# Patient Record
Sex: Male | Born: 2000 | Race: Black or African American | Hispanic: No | Marital: Single | Smoking: Never smoker
Health system: Southern US, Community
[De-identification: ages and names within clinical notes are randomized; demographics above are authoritative.]

## PROBLEM LIST (undated history)

## (undated) ENCOUNTER — Ambulatory Visit (HOSPITAL_COMMUNITY): Admission: EM | Discharge status: Absent without leave | Payer: Medicaid Other

## (undated) ENCOUNTER — Ambulatory Visit (HOSPITAL_COMMUNITY): Admission: EM | Disposition: A | Discharge status: Home / Self Care | Payer: Medicaid Other

## (undated) DIAGNOSIS — R519 Headache, unspecified: Secondary | ICD-10-CM

## (undated) DIAGNOSIS — F812 Mathematics disorder: Secondary | ICD-10-CM

## (undated) DIAGNOSIS — F810 Specific reading disorder: Secondary | ICD-10-CM

## (undated) DIAGNOSIS — F333 Major depressive disorder, recurrent, severe with psychotic symptoms: Secondary | ICD-10-CM

## (undated) DIAGNOSIS — F419 Anxiety disorder, unspecified: Secondary | ICD-10-CM

## (undated) DIAGNOSIS — F79 Unspecified intellectual disabilities: Secondary | ICD-10-CM

## (undated) DIAGNOSIS — F938 Other childhood emotional disorders: Secondary | ICD-10-CM

## (undated) DIAGNOSIS — F909 Attention-deficit hyperactivity disorder, unspecified type: Secondary | ICD-10-CM

## (undated) DIAGNOSIS — J45909 Unspecified asthma, uncomplicated: Secondary | ICD-10-CM

## (undated) DIAGNOSIS — R51 Headache: Secondary | ICD-10-CM

## (undated) DIAGNOSIS — Z8659 Personal history of other mental and behavioral disorders: Secondary | ICD-10-CM

## (undated) HISTORY — PX: TYMPANOSTOMY TUBE PLACEMENT: SHX32

---
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
## 2020-01-28 ENCOUNTER — Ambulatory Visit (HOSPITAL_COMMUNITY): Payer: Self-pay

## 2020-01-29 ENCOUNTER — Ambulatory Visit (HOSPITAL_COMMUNITY)
Admission: EM | Admit: 2020-01-29 | Discharge: 2020-01-29 | Disposition: A | Discharge status: Home / Self Care | Payer: Medicaid Other | Attending: Internal Medicine | Admitting: Internal Medicine

## 2020-01-29 ENCOUNTER — Other Ambulatory Visit: Payer: Self-pay

## 2020-01-29 ENCOUNTER — Encounter (HOSPITAL_COMMUNITY): Payer: Self-pay | Admitting: Emergency Medicine

## 2020-01-29 DIAGNOSIS — R197 Diarrhea, unspecified: Secondary | ICD-10-CM | POA: Insufficient documentation

## 2020-01-29 DIAGNOSIS — Z20822 Contact with and (suspected) exposure to covid-19: Secondary | ICD-10-CM | POA: Insufficient documentation

## 2020-01-29 DIAGNOSIS — Z658 Other specified problems related to psychosocial circumstances: Secondary | ICD-10-CM | POA: Insufficient documentation

## 2020-01-29 DIAGNOSIS — F4325 Adjustment disorder with mixed disturbance of emotions and conduct: Secondary | ICD-10-CM | POA: Insufficient documentation

## 2020-01-29 DIAGNOSIS — R112 Nausea with vomiting, unspecified: Secondary | ICD-10-CM | POA: Diagnosis present

## 2020-01-29 DIAGNOSIS — F79 Unspecified intellectual disabilities: Secondary | ICD-10-CM | POA: Diagnosis not present

## 2020-01-29 DIAGNOSIS — Z791 Long term (current) use of non-steroidal anti-inflammatories (NSAID): Secondary | ICD-10-CM | POA: Diagnosis not present

## 2020-01-29 DIAGNOSIS — Z79899 Other long term (current) drug therapy: Secondary | ICD-10-CM | POA: Diagnosis not present

## 2020-01-29 DIAGNOSIS — Z113 Encounter for screening for infections with a predominantly sexual mode of transmission: Secondary | ICD-10-CM | POA: Insufficient documentation

## 2020-01-29 DIAGNOSIS — J4530 Mild persistent asthma, uncomplicated: Secondary | ICD-10-CM | POA: Diagnosis not present

## 2020-01-29 DIAGNOSIS — Z711 Person with feared health complaint in whom no diagnosis is made: Secondary | ICD-10-CM

## 2020-01-29 DIAGNOSIS — J069 Acute upper respiratory infection, unspecified: Secondary | ICD-10-CM | POA: Diagnosis not present

## 2020-01-29 LAB — CYTOLOGY, (ORAL, ANAL, URETHRAL) ANCILLARY ONLY
Chlamydia: POSITIVE — AB
Comment: NEGATIVE
Comment: NEGATIVE
Comment: NORMAL
Neisseria Gonorrhea: POSITIVE — AB
Trichomonas: NEGATIVE

## 2020-01-29 LAB — CBC WITH DIFFERENTIAL/PLATELET
Abs Immature Granulocytes: 0.01 10*3/uL (ref 0.00–0.07)
Basophils Absolute: 0.1 10*3/uL (ref 0.0–0.1)
Basophils Relative: 2 %
Eosinophils Absolute: 0.1 10*3/uL (ref 0.0–0.5)
Eosinophils Relative: 3 %
HCT: 47 % (ref 39.0–52.0)
Hemoglobin: 15 g/dL (ref 13.0–17.0)
Immature Granulocytes: 0 %
Lymphocytes Relative: 47 %
Lymphs Abs: 1.6 10*3/uL (ref 0.7–4.0)
MCH: 27.7 pg (ref 26.0–34.0)
MCHC: 31.9 g/dL (ref 30.0–36.0)
MCV: 86.7 fL (ref 80.0–100.0)
Monocytes Absolute: 0.4 10*3/uL (ref 0.1–1.0)
Monocytes Relative: 11 %
Neutro Abs: 1.3 10*3/uL — ABNORMAL LOW (ref 1.7–7.7)
Neutrophils Relative %: 37 %
Platelets: 359 10*3/uL (ref 150–400)
RBC: 5.42 MIL/uL (ref 4.22–5.81)
RDW: 15 % (ref 11.5–15.5)
WBC: 3.4 10*3/uL — ABNORMAL LOW (ref 4.0–10.5)
nRBC: 0 % (ref 0.0–0.2)

## 2020-01-29 LAB — BASIC METABOLIC PANEL
Anion gap: 8 (ref 5–15)
BUN: 7 mg/dL (ref 6–20)
CO2: 29 mmol/L (ref 22–32)
Calcium: 9.8 mg/dL (ref 8.9–10.3)
Chloride: 103 mmol/L (ref 98–111)
Creatinine, Ser: 1.08 mg/dL (ref 0.61–1.24)
GFR calc Af Amer: >60 mL/min (ref >60)
GFR calc non Af Amer: >60 mL/min (ref >60)
Glucose, Bld: 93 mg/dL (ref 70–99)
Potassium: 4 mmol/L (ref 3.5–5.1)
Sodium: 140 mmol/L (ref 135–145)

## 2020-01-29 LAB — LIPASE, BLOOD: Lipase: 27 U/L (ref 11–51)

## 2020-01-29 LAB — RPR: RPR Ser Ql: NONREACTIVE

## 2020-01-29 LAB — SARS CORONAVIRUS 2 (TAT 6-24 HRS): SARS Coronavirus 2: NEGATIVE

## 2020-01-29 LAB — HIV ANTIBODY (ROUTINE TESTING W REFLEX): HIV Screen 4th Generation wRfx: NONREACTIVE

## 2020-01-29 MED ORDER — ONDANSETRON HCL 4 MG PO TABS: 4.0000 mg | ORAL_TABLET | Freq: Three times a day (TID) | ORAL | 0 refills | Status: DC | PRN

## 2020-01-29 MED ORDER — ONDANSETRON 4 MG PO TBDP
ORAL_TABLET | ORAL | Status: AC
  Filled 2020-01-29: qty 1

## 2020-01-29 MED ORDER — ONDANSETRON 4 MG PO TBDP
4.0000 mg | ORAL_TABLET | Freq: Once | ORAL | Status: AC
  Administered 2020-01-29: 4 mg via ORAL

## 2020-01-29 MED ORDER — OMEPRAZOLE 20 MG PO CPDR: 20.0000 mg | DELAYED_RELEASE_CAPSULE | Freq: Every day | ORAL | 0 refills | Status: DC

## 2020-01-29 NOTE — Discharge Instructions (Signed)
Zofran every 8 hours as needed for nausea or vomiting.   Please start daily omeprazole.  We will call for any concerning lab findings.  You may monitor your results on your MyChart online as well.   Please use condoms to prevent stds.  If symptoms worsen or do not improve in the next week to return to be seen or to follow up with your PCP.

## 2020-01-29 NOTE — ED Notes (Signed)
Patient able to ambulate independently  

## 2020-01-29 NOTE — ED Provider Notes (Signed)
[facility]    CSN: [id number] Arrival date & time: 01/29/20  7425      History   Chief Complaint Chief Complaint  Patient presents with  . Diarrhea  . Cough    HPI Jorge Day is a 19 y.o. male.   Jorge Day presents with complaints of abdominal pain, vomiting and diarrhea. Symptoms started 1 week ago. Vomited twice yesterday. Prior to that 4-5 times a day. Today has vomited once when he woke today. Black in emesis. Diarrhea, no black or blood to stool. Has had diarrhea approximately 3 times a day. Today had one episode of diarrhea. Chills, no fevers. LUQ abdominal pain. Comes and goes. Hasn't taken any medications for his symptoms. At work had an ill coworker, unknown if they had covid, two weeks ago. Some cough, congestion and sore throat as well. No history of covid-19 and has not received vaccination. Feels weak, no dizziness. Some headache. Eating and drinking triggers emesis. Still urinating well. Has had similar in the past which did resolve, was given medications at the time. No known PUD.   Also requesting std screening. His partner told him she has gonorrhea and chlamydia. He was notified 5 days ago. Maybe some burning with urination. No penile discharge. Doesn't use condoms. 1 partner. Has had stds in the past.    ROS per HPI, negative if not otherwise mentioned.      Past Medical History:  Diagnosis Date  . ADHD (attention deficit hyperactivity disorder)   . Anxiety   . 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 Asthma   . Headache   . History of ADHD 04/16/2015  . Intellectual disability 04/16/2015  . 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 Influenza vaccination declined  04/12/2016  . 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 Intellectual disability 04/16/2015  . Oppositional defiant disorder 04/15/2015  . Family circumstance 12/16/2014  . Psychosocial stressors 12/16/2014  . Mild persistent asthma 10/29/2013  . Seasonal allergies 10/29/2013    Past Surgical History:  Procedure Laterality Date  . CLOSED REDUCTION FINGER WITH PERCUTANEOUS PINNING Right 07/23/2016   Procedure: CLOSED REDUCTION PERCUTANEOUS PINNING OF RIGHT SMALL METACARPAL FRACTURE;  Surgeon: Dairl Ponder, MD;  Location: [facility];  Service: Orthopedics;  Laterality: Right;  . TYMPANOSTOMY TUBE PLACEMENT         Home Medications    Prior to Admission medications   Medication Sig Start Date End Date Taking? Authorizing Provider  albuterol (VENTOLIN HFA) 108 (90 Base) MCG/ACT inhaler Inhale 2 puffs into the lungs every 6 (six) hours as needed for wheezing or shortness of breath.  10/19/15   [provider]  Cetirizine HCl 10 MG CAPS Take 1 capsule (10 mg total) by mouth daily. 02/27/19   Wieters, Hallie C, PA-C  CONCERTA 18 MG CR tablet Take 18 mg by mouth every morning. 12/24/18   [provider]  fluticasone (FLONASE) 50 MCG/ACT nasal spray Place 1 spray into both nostrils daily. Patient taking differently: Place 1 spray into both nostrils daily as needed for allergies.  05/12/19   Avegno, Zachery Dakins, FNP  hydrOXYzine (ATARAX/VISTARIL) 50 MG tablet  Take 1 tablet (50 mg total) by mouth at bedtime. 12/27/18   Leata Mouse, MD  ibuprofen (ADVIL) 800 MG tablet Take 1 tablet (800 mg total) by mouth 3 (three) times daily. 07/18/19   Eustace Moore, MD  OLANZapine (ZYPREXA) 15 MG tablet Take 15 mg by mouth at bedtime. 06/12/19   [provider]  omeprazole (PRILOSEC) 20 MG capsule Take 1  capsule (20 mg total) by mouth daily. 01/29/20   Georgetta Haber, NP  ondansetron (ZOFRAN) 4 MG tablet Take 1 tablet (4 mg total) by mouth every 8 (eight) hours as needed for nausea or vomiting. 01/29/20   Georgetta Haber, NP  traZODone (DESYREL) 50 MG tablet Take 50 mg by mouth at bedtime as needed for sleep. 05/14/19   [provider]  dicyclomine (BENTYL) 20 MG tablet Take 1 tablet (20 mg total) by mouth 4 (four) times daily -  before meals and at bedtime. 02/27/19 07/18/19  Wieters, Junius Creamer, PA-C    Family History Family History  Problem Relation Age of Onset  . Depression Mother   . Hypertension Mother   . Asthma Father   . Alcohol abuse Father   . Mental illness Brother   . Mental illness Maternal Aunt   . Cancer Maternal Grandmother   . Brain cancer Maternal Grandmother     Social History Social History   Tobacco Use  . Smoking status: Passive Smoke Exposure - Never Smoker  . Smokeless tobacco: Never Used  Substance Use Topics  . Alcohol use: No  . Drug use: No     Allergies   Patient has no known allergies.   Review of Systems Review of Systems   Physical Exam Triage Vital Signs ED Triage Vitals  Enc Vitals Group     BP 01/29/20 0825 126/73     Pulse Rate 01/29/20 0825 (!) 57     Resp 01/29/20 0825 18     Temp 01/29/20 0825 98.5 F (36.9 C)     Temp src --      SpO2 01/29/20 0825 100 %     Weight --      Height --      Head Circumference --      Peak Flow --      Pain Score 01/29/20 0826 4     Pain Loc --      Pain Edu? --      Excl. in GC? --    No data found.  Updated Vital Signs BP 126/73 (BP Location: Left Arm)   Pulse (!) 57   Temp 98.5 F (36.9 C)   Resp 18   SpO2 100%   Visual Acuity Right Eye Distance:   Left Eye Distance:   Bilateral Distance:    Right Eye Near:   Left Eye Near:    Bilateral Near:     Physical Exam Constitutional:      Appearance: He is well-developed.  Cardiovascular:     Rate and Rhythm:  Normal rate.  Pulmonary:     Effort: Pulmonary effort is normal.  Abdominal:     Tenderness: There is abdominal tenderness in the epigastric area.  Skin:    General: Skin is warm and dry.  Neurological:     Mental Status: He is alert and oriented to person, place, and time.      UC Treatments / Results  Labs (all labs ordered are listed, but only abnormal results are displayed) Labs Reviewed  SARS CORONAVIRUS 2 (  TAT 6-24 HRS)  CBC WITH DIFFERENTIAL/PLATELET  BASIC METABOLIC PANEL  HIV ANTIBODY (ROUTINE TESTING W REFLEX)  RPR  LIPASE, BLOOD  CYTOLOGY, (ORAL, ANAL, URETHRAL) ANCILLARY ONLY    EKG   Radiology No results found.  Procedures Procedures (including critical care time)  Medications Ordered in UC Medications  ondansetron (ZOFRAN-ODT) disintegrating tablet 4 mg (4 mg Oral Given 01/29/20 0858)    Initial Impression / Assessment and Plan / UC Course  I have reviewed the triage vital signs and the nursing notes.  Pertinent labs & imaging results that were available during my care of the patient were reviewed by me and considered in my medical decision making (see chart for details).     zofran and omeprazole provided for epigastric pain. Basic labs and lipase pending. covid testing collected and pending as well. Std screening collected, opted to wait on treatment due to current N/V/D symptoms. Return precautions provided. Patient verbalized understanding and agreeable to plan.   Final Clinical Impressions(s) / UC Diagnoses   Final diagnoses:  Nausea vomiting and diarrhea  Upper respiratory tract infection, unspecified type  Concern about STD in male without diagnosis  Screen for STD (sexually transmitted disease)     Discharge Instructions     Zofran every 8 hours as needed for nausea or vomiting.   Please start daily omeprazole.  We will call for any concerning lab findings.  You may monitor your results on your MyChart online as well.   Please use  condoms to prevent stds.  If symptoms worsen or do not improve in the next week to return to be seen or to follow up with your PCP.      ED Prescriptions    Medication Sig Dispense Auth. Provider   ondansetron (ZOFRAN) 4 MG tablet Take 1 tablet (4 mg total) by mouth every 8 (eight) hours as needed for nausea or vomiting. 10 tablet Linus Mako B, NP   omeprazole (PRILOSEC) 20 MG capsule Take 1 capsule (20 mg total) by mouth daily. 30 capsule Georgetta Haber, NP     PDMP not reviewed this encounter.   Georgetta Haber, NP 01/29/20 [id number]

## 2020-01-29 NOTE — ED Triage Notes (Signed)
Pt presents to [facility] for assessment of diarrhea, emesis, cough x 3 days.  Patient states 2 episodes of emesis in the past 24 hours.  Pt c/o epigastric discomfort.

## 2020-02-03 ENCOUNTER — Telehealth (HOSPITAL_COMMUNITY): Payer: Self-pay | Admitting: Emergency Medicine

## 2020-02-03 MED ORDER — AZITHROMYCIN 250 MG PO TABS: 1000.0000 mg | ORAL_TABLET | Freq: Once | ORAL | 0 refills | Status: AC

## 2020-02-03 NOTE — Progress Notes (Signed)
Attempted to reach patient x 1, unable to LVM  Positive for Gonorrhea and Chlamydia.  Will need treatment with 500mg  IM Rocephin and 1G Azithromycin.  Azithromycin sent to pharmacy on file.  Patient will need to return for Rocephin.

## 2020-02-05 ENCOUNTER — Other Ambulatory Visit: Payer: Self-pay

## 2020-02-05 ENCOUNTER — Telehealth (HOSPITAL_COMMUNITY): Payer: Self-pay | Admitting: Emergency Medicine

## 2020-02-05 ENCOUNTER — Ambulatory Visit (HOSPITAL_COMMUNITY)
Admission: EM | Admit: 2020-02-05 | Discharge: 2020-02-05 | Disposition: A | Discharge status: Home / Self Care | Payer: Medicaid Other | Attending: Internal Medicine | Admitting: Internal Medicine

## 2020-02-05 DIAGNOSIS — A549 Gonococcal infection, unspecified: Secondary | ICD-10-CM | POA: Diagnosis not present

## 2020-02-05 DIAGNOSIS — A545 Gonococcal pharyngitis: Secondary | ICD-10-CM | POA: Diagnosis not present

## 2020-02-05 MED ORDER — CEFTRIAXONE SODIUM 500 MG IJ SOLR
500.0000 mg | Freq: Once | INTRAMUSCULAR | Status: AC
  Administered 2020-02-05: 500 mg via INTRAMUSCULAR

## 2020-02-05 MED ORDER — LIDOCAINE HCL (PF) 1 % IJ SOLN
INTRAMUSCULAR | Status: AC
  Filled 2020-02-05: qty 2

## 2020-02-05 MED ORDER — AZITHROMYCIN 250 MG PO TABS: 1000.0000 mg | ORAL_TABLET | Freq: Once | ORAL | 0 refills | Status: AC

## 2020-02-05 MED ORDER — CEFTRIAXONE SODIUM 500 MG IJ SOLR
INTRAMUSCULAR | Status: AC
  Filled 2020-02-05: qty 500

## 2020-02-05 NOTE — ED Triage Notes (Signed)
Patient presents for treatment for Gonorrhea

## 2020-02-07 ENCOUNTER — Emergency Department (HOSPITAL_COMMUNITY)
Admission: EM | Admit: 2020-02-07 | Discharge: 2020-02-07 | Disposition: A | Discharge status: Home / Self Care | Payer: Medicaid Other | Attending: Emergency Medicine | Admitting: Emergency Medicine

## 2020-02-07 ENCOUNTER — Encounter (HOSPITAL_COMMUNITY): Payer: Self-pay | Admitting: *Deleted

## 2020-02-07 DIAGNOSIS — J4530 Mild persistent asthma, uncomplicated: Secondary | ICD-10-CM | POA: Insufficient documentation

## 2020-02-07 DIAGNOSIS — R109 Unspecified abdominal pain: Secondary | ICD-10-CM | POA: Diagnosis present

## 2020-02-07 DIAGNOSIS — K529 Noninfective gastroenteritis and colitis, unspecified: Secondary | ICD-10-CM

## 2020-02-07 DIAGNOSIS — Z7722 Contact with and (suspected) exposure to environmental tobacco smoke (acute) (chronic): Secondary | ICD-10-CM | POA: Diagnosis not present

## 2020-02-07 DIAGNOSIS — Z79899 Other long term (current) drug therapy: Secondary | ICD-10-CM | POA: Diagnosis not present

## 2020-02-07 DIAGNOSIS — F902 Attention-deficit hyperactivity disorder, combined type: Secondary | ICD-10-CM | POA: Diagnosis not present

## 2020-02-07 LAB — LIPASE, BLOOD: Lipase: 24 U/L (ref 11–51)

## 2020-02-07 LAB — URINALYSIS, ROUTINE W REFLEX MICROSCOPIC
Bilirubin Urine: NEGATIVE
Glucose, UA: NEGATIVE mg/dL
Hgb urine dipstick: NEGATIVE
Ketones, ur: NEGATIVE mg/dL
Nitrite: NEGATIVE
Protein, ur: 30 mg/dL — AB
Specific Gravity, Urine: 1.025 (ref 1.005–1.030)
pH: 5 (ref 5.0–8.0)

## 2020-02-07 LAB — CBC
HCT: 44.8 % (ref 39.0–52.0)
Hemoglobin: 14.4 g/dL (ref 13.0–17.0)
MCH: 27.8 pg (ref 26.0–34.0)
MCHC: 32.1 g/dL (ref 30.0–36.0)
MCV: 86.5 fL (ref 80.0–100.0)
Platelets: 313 10*3/uL (ref 150–400)
RBC: 5.18 MIL/uL (ref 4.22–5.81)
RDW: 14.5 % (ref 11.5–15.5)
WBC: 5.5 10*3/uL (ref 4.0–10.5)
nRBC: 0 % (ref 0.0–0.2)

## 2020-02-07 LAB — COMPREHENSIVE METABOLIC PANEL
ALT: 13 U/L (ref 0–44)
AST: 23 U/L (ref 15–41)
Albumin: 4.4 g/dL (ref 3.5–5.0)
Alkaline Phosphatase: 47 U/L (ref 38–126)
Anion gap: 12 (ref 5–15)
BUN: 6 mg/dL (ref 6–20)
CO2: 24 mmol/L (ref 22–32)
Calcium: 9.8 mg/dL (ref 8.9–10.3)
Chloride: 104 mmol/L (ref 98–111)
Creatinine, Ser: 1.24 mg/dL (ref 0.61–1.24)
GFR calc Af Amer: >60 mL/min (ref >60)
GFR calc non Af Amer: >60 mL/min (ref >60)
Glucose, Bld: 88 mg/dL (ref 70–99)
Potassium: 3.5 mmol/L (ref 3.5–5.1)
Sodium: 140 mmol/L (ref 135–145)
Total Bilirubin: 1.2 mg/dL (ref 0.3–1.2)
Total Protein: 7.2 g/dL (ref 6.5–8.1)

## 2020-02-07 MED ORDER — ONDANSETRON 4 MG PO TBDP: 4.0000 mg | ORAL_TABLET | Freq: Three times a day (TID) | ORAL | 0 refills | Status: DC | PRN

## 2020-02-07 MED ORDER — SODIUM CHLORIDE 0.9 % IV BOLUS
1000.0000 mL | Freq: Once | INTRAVENOUS | Status: AC
  Administered 2020-02-07: 1000 mL via INTRAVENOUS

## 2020-02-07 MED ORDER — ONDANSETRON 4 MG PO TBDP
4.0000 mg | ORAL_TABLET | Freq: Once | ORAL | Status: AC
  Administered 2020-02-07: 4 mg via ORAL
  Filled 2020-02-07: qty 1

## 2020-02-07 NOTE — ED Triage Notes (Signed)
To ED for eval of abd pain starting 3-4 days ago. Become worse. No pain with urination. Diarrhea and vomiting. zofran given by ems. Seen for same at [facility] in august.

## 2020-02-07 NOTE — Discharge Instructions (Signed)
At this time there does not appear to be the presence of an emergent medical condition, however there is always the potential for conditions to change. Please read and follow the below instructions.  Please return to the Emergency Department immediately for any new or worsening symptoms or if your symptoms do not improve within 3 days. Please be sure to follow up with your Primary Care Provider within one week regarding your visit today; please call their office to schedule an appointment even if you are feeling better for a follow-up visit. You may take the medication Zofran as prescribed to help with nausea/vomiting.  This medication will dissolve under your tongue.  You may also continue taking the medication omeprazole which was prescribed to you by the urgent care. Please drink plenty of water and get plenty of rest.  You may call the specialists at [facility] gastroenterology to schedule a follow-up appointment regarding your chronic abdominal symptoms.  Get help right away if: Your pain gets worse or moves to the right side of your belly, especially the right lower side. You cannot stop vomiting. Your pain is only in areas of your belly, such as the right side or the left lower part of the belly. You have fever or chills You have bloody or black poop, or poop that looks like tar. You have very bad pain, cramping, or bloating in your belly. You have signs of not having enough fluid or water in your body (dehydration), such as: Dark pee, very little pee, or no pee. Cracked lips. Dry mouth. Sunken eyes. Sleepiness. Weakness. You have trouble breathing or chest pain. You have testicle pain or swelling You have burning when you pee or discharge from your penis. You have any new/concerning or worsening of symptoms  Please read the additional information packets attached to your discharge summary.  Do not take your medicine if  develop an itchy rash, swelling in your mouth or lips, or  difficulty breathing; call 911 and seek immediate emergency medical attention if this occurs.  You may review your lab tests and imaging results in their entirety on your MyChart account.  Please discuss all results of fully with your primary care provider and other specialist at your follow-up visit.  Note: Portions of this text may have been transcribed using voice recognition software. Every effort was made to ensure accuracy; however, inadvertent computerized transcription errors may still be present.

## 2020-02-07 NOTE — ED Provider Notes (Addendum)
[facility] EMERGENCY DEPARTMENT Provider Note   CSN: [id number] Arrival date & time: 02/07/20  3295     History Chief Complaint  Patient presents with  . Abdominal Pain    Jorge Day is a 19 y.o. male history ADHD, MDD, anxiety, asthma.  Patient presents today for left-sided abdominal pain onset 3-4 days ago pain is intermittent pulling/cramping sensation nonradiating no clear aggravating or alleviating factors.  Patient reports small episode of nonbloody/nonbilious emesis this morning and few episodes of nonbloody diarrhea.  He reports this is a frequent problem for him in the past.  He reports he was prescribed Zofran by urgent care late last month with improvement of his symptoms.  He was also seen for the same back in April and symptoms resolved then as well.  Additionally patient reports he was concern for STI due to known positive exposure in August as well, he tested positive for gonorrhea/chlamydia.  Patient reports he received Rocephin and azithromycin, he completed that course and has had no further STI related concerns.  Denies fever/chills, chest pain/shortness of breath, dysuria/hematuria, penile discharge, testicular pain/swelling, melena, hematochezia, hematemesis, right-sided abdominal pain or any additional concerns.   HPI     Past Medical History:  Diagnosis Date  . ADHD (attention deficit hyperactivity disorder)   . Anxiety   . 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 Asthma   . Headache   . History of ADHD 04/16/2015  . Intellectual disability 04/16/2015  . 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 Influenza vaccination declined 04/12/2016  . ADHD (attention  deficit hyperactivity disorder), 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  . 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 Intellectual disability 04/16/2015  . Oppositional defiant disorder 04/15/2015  . Family circumstance 12/16/2014  . Psychosocial stressors 12/16/2014  . Mild persistent asthma 10/29/2013  . Seasonal allergies 10/29/2013    Past Surgical History:  Procedure Laterality Date  . CLOSED REDUCTION FINGER WITH PERCUTANEOUS PINNING Right 07/23/2016   Procedure: CLOSED REDUCTION PERCUTANEOUS PINNING OF RIGHT SMALL METACARPAL FRACTURE;  Surgeon: Dairl Ponder, MD;  Location: [facility] OR;  Service: Orthopedics;  Laterality: Right;  . TYMPANOSTOMY TUBE PLACEMENT         Family History  Problem Relation Age of Onset  . Depression Mother   . Hypertension Mother   . Asthma Father   . Alcohol abuse Father   . Mental illness Brother   . Mental illness Maternal Aunt   . Cancer Maternal Grandmother   . Brain cancer Maternal Grandmother     Social History   Tobacco Use  . Smoking status: Passive Smoke Exposure - Never Smoker  . Smokeless tobacco: Never Used  Substance Use Topics  . Alcohol use: No  . Drug use: No    Home Medications Prior to Admission medications   Medication Sig Start Date End Date Taking? Authorizing Provider  albuterol (VENTOLIN HFA) 108 (90 Base) MCG/ACT inhaler Inhale 2 puffs into the lungs every 6 (six) hours as needed for wheezing or shortness of breath.  10/19/15  Yes [provider]  Cetirizine HCl 10 MG CAPS Take 1 capsule (10 mg total) by mouth daily. 02/27/19  Yes Wieters, Maud C, PA-C  CONCERTA 18 MG CR tablet Take 18 mg by mouth every morning. 12/24/18  Yes [provider]  fluticasone (FLONASE) 50 MCG/ACT nasal spray Place 1 spray into both nostrils daily. Patient taking differently: Place 1 spray into both nostrils daily as needed for allergies.   05/12/19  Yes Avegno, Zachery Dakins, FNP  hydrOXYzine (ATARAX/VISTARIL) 50 MG tablet Take 1 tablet (50 mg total) by mouth at bedtime. 12/27/18  Yes Leata Mouse, MD  ibuprofen (ADVIL) 800 MG tablet Take 1 tablet (800 mg total) by mouth 3 (three) times daily. 07/18/19  Yes Eustace Moore, MD  omeprazole (PRILOSEC) 20 MG capsule Take 1 capsule (20 mg total) by mouth daily. 01/29/20  Yes Burky, Dorene Grebe B, NP  paliperidone (INVEGA) 6 MG 24 hr tablet Take 6 mg by mouth daily. 12/18/19  Yes [provider]  traZODone (DESYREL) 50 MG tablet Take 50 mg by mouth at bedtime as needed for sleep. 05/14/19  Yes [provider]  OLANZapine (ZYPREXA) 15 MG tablet Take 15 mg by mouth at bedtime. Patient not taking: Reported on 02/07/2020 06/12/19   [provider]  ondansetron (ZOFRAN ODT) 4 MG disintegrating tablet Take 1 tablet (4 mg total) by mouth every 8 (eight) hours as needed for nausea or vomiting. 02/07/20   Harlene Salts A, PA-C  dicyclomine (BENTYL) 20 MG tablet Take 1 tablet (20 mg total) by mouth 4 (four) times daily -  before meals and at bedtime. 02/27/19 07/18/19  Wieters, Hallie C, PA-C    Allergies    Patient has no known allergies.  Review of Systems   Review of Systems Ten systems are reviewed and are negative for acute change except as noted in the HPI  Physical Exam Updated Vital Signs BP 127/71 (BP Location: Right Arm)   Pulse 60   Temp 97.6 F (36.4 C) (Oral)   Resp 16   SpO2 99%   Physical Exam Constitutional:      General: He is not in acute distress.    Appearance: Normal appearance. He is well-developed. He is not ill-appearing or diaphoretic.  HENT:     Head: Normocephalic and atraumatic.  Eyes:     General: Vision grossly intact. Gaze aligned appropriately.     Pupils: Pupils are equal, round, and reactive to light.  Neck:     Trachea: Trachea and phonation normal.  Pulmonary:     Effort: Pulmonary effort is normal. No respiratory  distress.  Abdominal:     General: There is no distension.     Palpations: Abdomen is soft.     Tenderness: There is abdominal tenderness in the left upper quadrant. There is no guarding or rebound. Negative signs include Murphy's sign and McBurney's sign.  Genitourinary:    Comments: Deferred by patient Musculoskeletal:        General: Normal range of motion.     Cervical back: Normal range of motion.  Skin:    General: Skin is warm and dry.  Neurological:     Mental Status: He is alert.     GCS: GCS eye subscore is 4. GCS verbal subscore is 5. GCS motor subscore is 6.     Comments: Speech is clear and goal oriented, follows commands Major Cranial nerves without deficit, no facial droop Moves extremities without ataxia, coordination intact  Psychiatric:        Behavior: Behavior normal.    ED Results / Procedures / Treatments   Labs (all labs ordered are listed, but only abnormal  results are displayed) Labs Reviewed  URINALYSIS, ROUTINE W REFLEX MICROSCOPIC - Abnormal; Notable for the following components:      Result Value   APPearance HAZY (*)    Protein, ur 30 (*)    Leukocytes,Ua MODERATE (*)    Bacteria, UA RARE (*)    Non Squamous Epithelial 0-5 (*)    All other components within normal limits  URINE CULTURE  LIPASE, BLOOD  COMPREHENSIVE METABOLIC PANEL  CBC    EKG None  Radiology No results found.  Procedures Procedures (including critical care time)  Medications Ordered in ED Medications  sodium chloride 0.9 % bolus 1,000 mL (1,000 mLs Intravenous New Bag/Given 02/07/20 1023)  ondansetron (ZOFRAN-ODT) disintegrating tablet 4 mg (4 mg Oral Given 02/07/20 1025)    ED Course  I have reviewed the triage vital signs and the nursing notes.  Pertinent labs & imaging results that were available during my care of the patient were reviewed by me and considered in my medical decision making (see chart for details).    MDM Rules/Calculators/A&P                           Additional history obtained from: 1. Nursing notes from this visit. 2. Electronic medical record.  Patient received treatment for gonorrhea/chlamydia through urgent care.  He presented for similar symptoms of left-sided abdominal pain in April 2021, had negative CT abdomen pelvis at that time. ------------------------------ I ordered, reviewed and interpreted labs which include: CBC within normal limits, no anemia or leukocytosis. CMP within normal limits, no emergent electrolyte derangement, AKI, LFT elevations or gap. Lipase within normal limits. Urinalysis shows WBCs, bacteria, leukocytes, nitrite negative. Urine culture in process.  19 year old male presented for left-sided abdominal pain for 3-4 days similar to pain that occurred in August and in April.  In April he underwent CT scan which was negative in the emergency department.  Pain is waxing and waning.  On evaluation he is well-appearing no acute distress minimal left-sided abdominal pain without rebound guarding or tenderness.  No right upper quadrant or right lower quadrant tenderness and no central abdominal pain.  He is afebrile with normal vital signs.  Blood work today is reassuring, no leukocytosis.  He does have some bacteria in his urine but was just treated 3 days ago for gonorrhea/chlamydia through the urgent care.  He has no testicular pain swelling or urinary symptoms to suggest epididymitis or other ascending infections, he did defer GU examination which I feel is reasonable based on patient's symptoms today.  Patient stated understanding of limitations of examination without GU exam.  Suspect abnormalities in UA secondary to recently treated GC chlamydia.  Culture sent, patient encouraged to follow-up with PCP.  Based on symptoms and presentation and work-up today doubt cholecystitis, pancreatitis, appendicitis, SBO, diverticulitis, epididymitis or other emergent intra-abdominal pelvic etiologies.  I discussed imaging/CT  scan for evaluation of patient's abdominal pain today, shared decision making made including risks versus benefits.  Patient elects to forego CT scan today which I feel is a reasonable choice given his age and wanting to avoid excess radiation.  Suspect patient experiencing gastroenteritis, will refer to gastroenterology given recurrence of symptoms over this year and encourage PCP follow-up.  At this time there does not appear to be any evidence of an acute emergency medical condition and the patient appears stable for discharge with appropriate outpatient follow up. Diagnosis was discussed with patient who verbalizes understanding of care plan  and is agreeable to discharge. I have discussed return precautions with patient who verbalizes understanding. Patient encouraged to follow-up with their PCP and GI. All questions answered.  Patient's case discussed with Dr. Criss Alvine who agrees with plan to discharge with outpatient follow-up.   Note: Portions of this report may have been transcribed using voice recognition software. Every effort was made to ensure accuracy; however, inadvertent computerized transcription errors may still be present. Final Clinical Impression(s) / ED Diagnoses Final diagnoses:  Abdominal pain, unspecified abdominal location  Gastroenteritis    Rx / DC Orders ED Discharge Orders         Ordered    ondansetron (ZOFRAN ODT) 4 MG disintegrating tablet  Every 8 hours PRN        02/07/20 1125           Elizabeth Palau 02/07/20 1133    Pricilla Loveless, MD 02/07/20 1301

## 2020-02-08 LAB — URINE CULTURE: Culture: NO GROWTH

## 2020-04-13 ENCOUNTER — Ambulatory Visit (HOSPITAL_COMMUNITY)
Admission: EM | Admit: 2020-04-13 | Discharge: 2020-04-13 | Disposition: A | Discharge status: Home / Self Care | Payer: Medicaid Other | Attending: Family Medicine | Admitting: Family Medicine

## 2020-04-13 ENCOUNTER — Other Ambulatory Visit: Payer: Self-pay

## 2020-04-13 ENCOUNTER — Encounter (HOSPITAL_COMMUNITY): Payer: Self-pay

## 2020-04-13 DIAGNOSIS — Z113 Encounter for screening for infections with a predominantly sexual mode of transmission: Secondary | ICD-10-CM | POA: Insufficient documentation

## 2020-04-13 DIAGNOSIS — H1031 Unspecified acute conjunctivitis, right eye: Secondary | ICD-10-CM | POA: Insufficient documentation

## 2020-04-13 LAB — HIV ANTIBODY (ROUTINE TESTING W REFLEX): HIV Screen 4th Generation wRfx: NONREACTIVE

## 2020-04-13 MED ORDER — TOBRAMYCIN 0.3 % OP SOLN: 1.0000 [drp] | Freq: Four times a day (QID) | OPHTHALMIC | 0 refills | Status: DC

## 2020-04-13 MED ORDER — OLOPATADINE HCL 0.2 % OP SOLN: 1.0000 [drp] | Freq: Every day | OPHTHALMIC | 0 refills | Status: DC

## 2020-04-13 NOTE — ED Provider Notes (Signed)
[facility] CARE [facility]ENTER   [id number] 04/13/20 Arrival Time: 0906  ASSESSMENT & PLAN:  1. Acute conjunctivitis of right eye, unspecified acute conjunctivitis type   2. Screening for STDs (sexually transmitted diseases)     Begin: Meds ordered this encounter  Medications  . Olopatadine HCl 0.2 % SOLN    Sig: Apply 1 drop to eye daily.    Dispense:  2.5 mL    Refill:  0  . tobramycin (TOBREX) 0.3 % ophthalmic solution    Sig: Place 1 drop into the right eye every 6 (six) hours.    Dispense:  5 mL    Refill:  0    Discussed the diagnosis and proper care of conjunctivitis.  Stressed household Presenter, broadcasting. Ophthalmic drops per orders. Warm compress to eye(s). Local eye care discussed. School note provided.    Discharge Instructions     We have sent testing for sexually transmitted infections. We will notify you of any positive results once they are received. If required, we will prescribe any medications you might need.  Please refrain from all sexual activity for at least the next seven days.      Reviewed expectations re: course of current medical issues. Questions answered. Outlined signs and symptoms indicating need for more acute intervention. Patient verbalized understanding. After Visit Summary given.   SUBJECTIVE:  Jorge Day is a 19 y.o. male who presents with complaint of persistent L eye irritation.  Onset abrupt, approximately 2-3 days ago. Itchy/gritty feeling. Injury: no. Visual changes: no. Contact lens use: no. Recent illness: no. Self treatment: none.  Also requets STD testing. No symptoms. Is sexually active. H/O treated chlamydia.  OBJECTIVE:  Vitals:   04/13/20 0937  BP: (!) 122/58  Pulse: (!) 59  Resp: 17  Temp: 98.5 F (36.9 C)  TempSrc: Oral  SpO2: 100%    General appearance: alert; no distress HEENT: [place]; AT; PERRLA; no restriction of extraocular movements OS: without reported pain; with conjunctival injection; with watery  drainage; without corneal opacities; without limbal flush; without periorbital swelling or erythema OD: normal Neck: supple without LAD Lungs: unlabored respirations Skin: warm and dry Psychological: alert and cooperative; normal mood and affect     No Known Allergies  Past Medical History:  Diagnosis Date  . ADHD (attention deficit hyperactivity disorder)   . Anxiety   . 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 Asthma   . Headache   . History of ADHD 04/16/2015  . Intellectual disability 04/16/2015  . 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 Marital status: Single    Spouse name: Not on file  . Number of children: Not on file  . Years of education: Not on file  . Highest education level: Not on file  Occupational History  . Not on file  Tobacco Use  . Smoking status: Passive Smoke Exposure - Never Smoker  . Smokeless tobacco: Never Used  Substance and Sexual Activity  . Alcohol use: No  . Drug use: No  . Sexual activity: Yes    Birth control/protection: None  Other Topics Concern  . Not on file  Social History Narrative  . Not on file   Social Determinants of Health   Financial Resource Strain:   . Difficulty of Paying Living Expenses: Not on file  Food Insecurity:   . Worried About Programme researcher, broadcasting/film/video  in the Last Year: Not on file  . Ran Out of Food in the Last Year: Not on file  Transportation Needs:   . Lack of Transportation (Medical): Not on file  . Lack of Transportation (Non-Medical): Not on file  Physical Activity:   . Days of Exercise per Week: Not on file  . Minutes of Exercise per Session: Not on file  Stress:   . Feeling of Stress : Not on file  Social Connections:   . Frequency of Communication with Friends and Family: Not on file  . Frequency of Social Gatherings  with Friends and Family: Not on file  . Attends Religious Services: Not on file  . Active Member of Clubs or Organizations: Not on file  . Attends Banker Meetings: Not on file  . Marital Status: Not on file  Intimate Partner Violence:   . Fear of Current or Ex-Partner: Not on file  . Emotionally Abused: Not on file  . Physically Abused: Not on file  . Sexually Abused: Not on file   Family History  Problem Relation Age of Onset  . Depression Mother   . Hypertension Mother   . Asthma Father   . Alcohol abuse Father   . Mental illness Brother   . Mental illness Maternal Aunt   . Cancer Maternal Grandmother   . Brain cancer Maternal Grandmother    Past Surgical History:  Procedure Laterality Date  . CLOSED REDUCTION FINGER WITH PERCUTANEOUS PINNING Right 07/23/2016   Procedure: CLOSED REDUCTION PERCUTANEOUS PINNING OF RIGHT SMALL METACARPAL FRACTURE;  Surgeon: Dairl Ponder, MD;  Location: [facility] OR;  Service: Orthopedics;  Laterality: Right;  . TYMPANOSTOMY TUBE PLACEMENT       Mardella Layman, MD 04/13/20 1015

## 2020-04-13 NOTE — Discharge Instructions (Addendum)
We have sent testing for sexually transmitted infections. We will notify you of any positive results once they are received. If required, we will prescribe any medications you might need.  Please refrain from all sexual activity for at least the next seven days.  

## 2020-04-13 NOTE — ED Triage Notes (Signed)
Pt is here with right eye irritation that started 3 days ago, pt has taken drops to relieve discomfort.

## 2020-04-14 LAB — CYTOLOGY, (ORAL, ANAL, URETHRAL) ANCILLARY ONLY
Chlamydia: NEGATIVE
Comment: NEGATIVE
Comment: NEGATIVE
Comment: NORMAL
Neisseria Gonorrhea: NEGATIVE
Trichomonas: NEGATIVE

## 2020-04-14 LAB — RPR
RPR Ser Ql: REACTIVE — AB
RPR Titer: 1:1 {titer}

## 2020-04-15 LAB — T.PALLIDUM AB, TOTAL: T Pallidum Abs: NONREACTIVE

## 2020-05-31 ENCOUNTER — Other Ambulatory Visit: Payer: Self-pay

## 2020-05-31 ENCOUNTER — Ambulatory Visit (HOSPITAL_COMMUNITY)
Admission: EM | Admit: 2020-05-31 | Discharge: 2020-05-31 | Discharge status: Against Medical Advice | Payer: Medicaid Other

## 2020-06-03 ENCOUNTER — Ambulatory Visit (HOSPITAL_COMMUNITY)
Admission: EM | Admit: 2020-06-03 | Discharge: 2020-06-03 | Disposition: A | Discharge status: Home / Self Care | Payer: Medicaid Other | Attending: Urgent Care | Admitting: Urgent Care

## 2020-06-03 ENCOUNTER — Encounter (HOSPITAL_COMMUNITY): Payer: Self-pay

## 2020-06-03 ENCOUNTER — Other Ambulatory Visit: Payer: Self-pay

## 2020-06-03 DIAGNOSIS — J45909 Unspecified asthma, uncomplicated: Secondary | ICD-10-CM | POA: Diagnosis not present

## 2020-06-03 DIAGNOSIS — F79 Unspecified intellectual disabilities: Secondary | ICD-10-CM | POA: Insufficient documentation

## 2020-06-03 DIAGNOSIS — U071 COVID-19: Secondary | ICD-10-CM | POA: Insufficient documentation

## 2020-06-03 DIAGNOSIS — K6289 Other specified diseases of anus and rectum: Secondary | ICD-10-CM | POA: Diagnosis present

## 2020-06-03 DIAGNOSIS — R6889 Other general symptoms and signs: Secondary | ICD-10-CM | POA: Diagnosis present

## 2020-06-03 DIAGNOSIS — Z79899 Other long term (current) drug therapy: Secondary | ICD-10-CM | POA: Insufficient documentation

## 2020-06-03 DIAGNOSIS — Z202 Contact with and (suspected) exposure to infections with a predominantly sexual mode of transmission: Secondary | ICD-10-CM | POA: Insufficient documentation

## 2020-06-03 DIAGNOSIS — Z7251 High risk heterosexual behavior: Secondary | ICD-10-CM | POA: Insufficient documentation

## 2020-06-03 LAB — HIV ANTIBODY (ROUTINE TESTING W REFLEX): HIV Screen 4th Generation wRfx: NONREACTIVE

## 2020-06-03 LAB — RESP PANEL BY RT-PCR (FLU A&B, COVID) ARPGX2
Influenza A by PCR: NEGATIVE
Influenza B by PCR: NEGATIVE
SARS Coronavirus 2 by RT PCR: POSITIVE — AB

## 2020-06-03 LAB — RPR
RPR Ser Ql: REACTIVE — AB
RPR Titer: 1:1 {titer}

## 2020-06-03 MED ORDER — CEFTRIAXONE SODIUM 500 MG IJ SOLR
INTRAMUSCULAR | Status: AC
  Filled 2020-06-03: qty 500

## 2020-06-03 MED ORDER — CEFTRIAXONE SODIUM 500 MG IJ SOLR
500.0000 mg | Freq: Once | INTRAMUSCULAR | Status: AC
  Administered 2020-06-03: 09:00:00 500 mg via INTRAMUSCULAR

## 2020-06-03 MED ORDER — LIDOCAINE HCL (PF) 1 % IJ SOLN
INTRAMUSCULAR | Status: AC
  Filled 2020-06-03: qty 2

## 2020-06-03 MED ORDER — DOXYCYCLINE HYCLATE 100 MG PO CAPS: 100.0000 mg | ORAL_CAPSULE | Freq: Two times a day (BID) | ORAL | 0 refills | Status: DC

## 2020-06-03 NOTE — ED Triage Notes (Signed)
Pt in with c/o bodyaches and headaches that have been going on for 2 days now  Pt has taking nyquil with no relief  Also requesting testing/treatment for recent STD exposure. States his partner tested positive for chlamydia and gonnorhea

## 2020-06-03 NOTE — ED Provider Notes (Signed)
[facility]   MRN: [id number] DOB: 08-27-2000  Subjective:   Jorge Day is a 19 y.o. male presenting for 2-day history of acute onset body aches, headaches.  Patient has used NyQuil with relief.  He also had exposure to gonorrhea and chlamydia, his sex partner tested positive for this last week.  He states that he has some perianal pain.  Denies dysuria, hematuria, urinary frequency, penile discharge, penile swelling, testicular pain, testicular swelling, anal pain, groin pain.   No current facility-administered medications for this encounter.  Current Outpatient Medications:    albuterol (VENTOLIN HFA) 108 (90 Base) MCG/ACT inhaler, Inhale 2 puffs into the lungs every 6 (six) hours as needed for wheezing or shortness of breath. , Disp: , Rfl:    CONCERTA 18 MG CR tablet, Take 18 mg by mouth every morning., Disp: , Rfl:    hydrOXYzine (ATARAX/VISTARIL) 50 MG tablet, Take 1 tablet (50 mg total) by mouth at bedtime., Disp: 30 tablet, Rfl: 0   ibuprofen (ADVIL) 800 MG tablet, Take 1 tablet (800 mg total) by mouth 3 (three) times daily., Disp: 21 tablet, Rfl: 0   Olopatadine HCl 0.2 % SOLN, Apply 1 drop to eye daily., Disp: 2.5 mL, Rfl: 0   ondansetron (ZOFRAN ODT) 4 MG disintegrating tablet, Take 1 tablet (4 mg total) by mouth every 8 (eight) hours as needed for nausea or vomiting., Disp: 5 tablet, Rfl: 0   paliperidone (INVEGA) 6 MG 24 hr tablet, Take 6 mg by mouth daily., Disp: , Rfl:    tobramycin (TOBREX) 0.3 % ophthalmic solution, Place 1 drop into the right eye every 6 (six) hours., Disp: 5 mL, Rfl: 0   traZODone (DESYREL) 50 MG tablet, Take 50 mg by mouth at bedtime as needed for sleep., Disp: , Rfl:    No Known Allergies  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   Asthma    Headache    History of ADHD 04/16/2015   Intellectual disability  04/16/2015   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   MDD (major depressive disorder), recurrent, severe, 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     Past Surgical History:  Procedure Laterality Date   CLOSED REDUCTION FINGER WITH PERCUTANEOUS PINNING Right 07/23/2016   Procedure: CLOSED REDUCTION PERCUTANEOUS PINNING OF RIGHT SMALL METACARPAL FRACTURE;  Surgeon: Dairl Ponder, MD;  Location: [facility] OR;  Service: Orthopedics;  Laterality: Right;   TYMPANOSTOMY TUBE PLACEMENT      Family History  Problem Relation Age of Onset   Depression Mother    Hypertension Mother    Asthma Father    Alcohol abuse Father    Mental illness Brother    Mental illness Maternal Aunt    Cancer Maternal Grandmother    Brain cancer Maternal Grandmother     Social History   Tobacco Use   Smoking status: Passive Smoke Exposure - Never Smoker   Smokeless tobacco: Never Used  Substance Use Topics   Alcohol use: No   Drug use: No    ROS   Objective:   Vitals: BP 123/71 (BP Location: Left Arm)    Pulse 67    Temp 98.1 F (36.7 C) (Oral)    Resp 18    SpO2 98%   Physical Exam Constitutional:      General: He is not in acute distress.    Appearance: Normal  appearance. He is well-developed. He is not ill-appearing, toxic-appearing or diaphoretic.  HENT:     Head: Normocephalic and atraumatic.     Right Ear: External ear normal.     Left Ear: External ear normal.     Nose: Nose normal.     Mouth/Throat:     Mouth: Mucous membranes are moist.     Pharynx: Oropharynx is clear.  Eyes:     General: No scleral icterus.    Extraocular Movements: Extraocular movements intact.     Pupils: Pupils are equal, round, and reactive to light.  Cardiovascular:     Rate and Rhythm: Normal rate and regular rhythm.     Heart sounds: Normal heart sounds. No murmur heard. No friction rub. No gallop.   Pulmonary:     Effort: Pulmonary effort is normal. No  respiratory distress.     Breath sounds: Normal breath sounds. No stridor. No wheezing, rhonchi or rales.  Genitourinary:    Pubic Area: No rash.     Neurological:     Mental Status: He is alert and oriented to person, place, and time.  Psychiatric:        Mood and Affect: Mood normal.        Behavior: Behavior normal.        Thought Content: Thought content normal.      Assessment and Plan :   PDMP not reviewed this encounter.  1. Flu-like symptoms   2. Perianal pain   3. Unprotected sex   4. Exposure to STD     Patient treated empirically as per CDC guidelines with IM ceftriaxone, doxycycline as an outpatient.  Labs pending.   Counseled on safe sex practices including abstaining for 1 week following treatment.  Suspect influenza, testing pending.  Recommend supportive care.  Counseled patient on potential for adverse effects with medications prescribed/recommended today, ER and return-to-clinic precautions discussed, patient verbalized understanding.    Wallis Bamberg, PA-C 06/03/20 0930

## 2020-06-03 NOTE — Discharge Instructions (Addendum)
We will notify you of your COVID-19 and flu test results as they arrive and may take between 24 to 48 hours.  I encourage you to sign up for MyChart if you have not already done so as this can be the easiest way for [place] to communicate results to you online or through a phone app.  In the meantime, if you develop worsening symptoms including fever, chest pain, shortness of breath despite our current treatment plan then please report to the emergency room as this may be a sign of worsening status from possible COVID-19 infection.  Otherwise, we will manage this as a viral syndrome. For sore throat or cough try using a honey-based tea. Use 3 teaspoons of honey with juice squeezed from half lemon. Place shaved pieces of ginger into 1/2-1 cup of water and warm over stove top. Then mix the ingredients and repeat every 4 hours as needed. Please take Tylenol 500mg -650mg  every 6 hours for aches and pains, fevers. Hydrate very well with at least 2 liters of water. Eat light meals such as soups to replenish electrolytes and soft fruits, veggies. Start an antihistamine like Zyrtec, Allegra or Claritin for postnasal drainage, sinus congestion.  You can take this together with pseudoephedrine (Sudafed) at a dose of 60 mg 2-3 times a day as needed for the same kind of congestion.    Avoid all forms of sexual intercourse (oral, vaginal, anal) for the next 7 days to avoid spreading/reinfecting. Return if symptoms worsen/do not resolve, you develop fever, abdominal pain, blood in your urine, or are re-exposed to an STI.

## 2020-06-04 LAB — CYTOLOGY, (ORAL, ANAL, URETHRAL) ANCILLARY ONLY
Chlamydia: POSITIVE — AB
Comment: NEGATIVE
Comment: NEGATIVE
Comment: NORMAL
Neisseria Gonorrhea: NEGATIVE
Trichomonas: POSITIVE — AB

## 2020-06-04 LAB — T.PALLIDUM AB, TOTAL: T Pallidum Abs: NONREACTIVE

## 2020-10-04 ENCOUNTER — Ambulatory Visit (HOSPITAL_COMMUNITY)
Admission: EM | Admit: 2020-10-04 | Discharge: 2020-10-04 | Disposition: A | Discharge status: Home / Self Care | Payer: Medicaid Other | Attending: Urgent Care | Admitting: Urgent Care

## 2020-10-04 ENCOUNTER — Other Ambulatory Visit: Payer: Self-pay

## 2020-10-04 ENCOUNTER — Encounter (HOSPITAL_COMMUNITY): Payer: Self-pay

## 2020-10-04 DIAGNOSIS — R21 Rash and other nonspecific skin eruption: Secondary | ICD-10-CM | POA: Diagnosis not present

## 2020-10-04 DIAGNOSIS — R300 Dysuria: Secondary | ICD-10-CM | POA: Insufficient documentation

## 2020-10-04 LAB — HIV ANTIBODY (ROUTINE TESTING W REFLEX): HIV Screen 4th Generation wRfx: NONREACTIVE

## 2020-10-04 MED ORDER — VALACYCLOVIR HCL 1 G PO TABS: 1000.0000 mg | ORAL_TABLET | Freq: Three times a day (TID) | ORAL | 0 refills | Status: DC

## 2020-10-04 NOTE — ED Provider Notes (Signed)
[facility]   MRN: [id number] DOB: 2000/08/07  Subjective:   Jorge Day is a 20 y.o. male presenting for 2 week history of persistent rash and pain along the penis.  Patient states that he had unprotected sex and symptoms started shortly thereafter.  Has also had intermittent discharge, dysuria.  Denies fever, nausea, vomiting, pelvic or testicular pain.  No current facility-administered medications for this encounter.  Current Outpatient Medications:  .  albuterol (VENTOLIN HFA) 108 (90 Base) MCG/ACT inhaler, Inhale 2 puffs into the lungs every 6 (six) hours as needed for wheezing or shortness of breath. , Disp: , Rfl:  .  CONCERTA 18 MG CR tablet, Take 18 mg by mouth every morning., Disp: , Rfl:  .  doxycycline (VIBRAMYCIN) 100 MG capsule, Take 1 capsule (100 mg total) by mouth 2 (two) times daily., Disp: 14 capsule, Rfl: 0 .  hydrOXYzine (ATARAX/VISTARIL) 50 MG tablet, Take 1 tablet (50 mg total) by mouth at bedtime., Disp: 30 tablet, Rfl: 0 .  ibuprofen (ADVIL) 800 MG tablet, Take 1 tablet (800 mg total) by mouth 3 (three) times daily., Disp: 21 tablet, Rfl: 0 .  Olopatadine HCl 0.2 % SOLN, Apply 1 drop to eye daily., Disp: 2.5 mL, Rfl: 0 .  ondansetron (ZOFRAN ODT) 4 MG disintegrating tablet, Take 1 tablet (4 mg total) by mouth every 8 (eight) hours as needed for nausea or vomiting., Disp: 5 tablet, Rfl: 0 .  paliperidone (INVEGA) 6 MG 24 hr tablet, Take 6 mg by mouth daily., Disp: , Rfl:  .  tobramycin (TOBREX) 0.3 % ophthalmic solution, Place 1 drop into the right eye every 6 (six) hours., Disp: 5 mL, Rfl: 0 .  traZODone (DESYREL) 50 MG tablet, Take 50 mg by mouth at bedtime as needed for sleep., Disp: , Rfl:    No Known Allergies  Past Medical History:  Diagnosis Date  . ADHD (attention deficit hyperactivity disorder)   . Anxiety   . 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 Asthma   . Headache   . History of ADHD 04/16/2015  . Intellectual  disability 04/16/2015  . 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     Past Surgical History:  Procedure Laterality Date  . CLOSED REDUCTION FINGER WITH PERCUTANEOUS PINNING Right 07/23/2016   Procedure: CLOSED REDUCTION PERCUTANEOUS PINNING OF RIGHT SMALL METACARPAL FRACTURE;  Surgeon: Dairl Ponder, MD;  Location: [facility] OR;  Service: Orthopedics;  Laterality: Right;  . TYMPANOSTOMY TUBE PLACEMENT      Family History  Problem Relation Age of Onset  . Depression Mother   . Hypertension Mother   . Asthma Father   . Alcohol abuse Father   . Mental illness Brother   . Mental illness Maternal Aunt   . Cancer Maternal Grandmother   . Brain cancer Maternal Grandmother     Social History   Tobacco Use  . Smoking status: Passive Smoke Exposure - Never Smoker  . Smokeless tobacco: Never Used  Substance Use Topics  . Alcohol use: No  . Drug use: No    ROS   Objective:   Vitals: BP (!) 150/78 (BP Location: Right Arm)   Pulse 69   Temp 98 F (36.7 C) (Oral)   Resp 18   Physical Exam Constitutional:      General: He is not in acute distress.    Appearance: Normal appearance. He is well-developed  and normal weight. He is not ill-appearing, toxic-appearing or diaphoretic.  HENT:     Head: Normocephalic and atraumatic.     Right Ear: External ear normal.     Left Ear: External ear normal.     Nose: Nose normal.     Mouth/Throat:     Pharynx: Oropharynx is clear.  Eyes:     General: No scleral icterus.       Right eye: No discharge.        Left eye: No discharge.     Extraocular Movements: Extraocular movements intact.     Pupils: Pupils are equal, round, and reactive to light.  Cardiovascular:     Rate and Rhythm: Normal rate.  Pulmonary:     Effort: Pulmonary effort is normal.  Genitourinary:    Penis: Circumcised. Lesions present. No  phimosis, paraphimosis, hypospadias, erythema, tenderness, discharge or swelling.     Musculoskeletal:     Cervical back: Normal range of motion.  Neurological:     Mental Status: He is alert and oriented to person, place, and time.  Psychiatric:        Mood and Affect: Mood normal.        Behavior: Behavior normal.        Thought Content: Thought content normal.        Judgment: Judgment normal.       Assessment and Plan :   PDMP not reviewed this encounter.  1. Rash of genital area   2. Dysuria     Will manage for clinical diagnosis of genital herpes.  Start Valtrex.  Labs pending.  Counseled on abstaining until we know exactly what his test results are. Counseled patient on potential for adverse effects with medications prescribed/recommended today, ER and return-to-clinic precautions discussed, patient verbalized understanding.    Wallis Bamberg, PA-C 10/04/20 1351

## 2020-10-04 NOTE — ED Triage Notes (Signed)
Pt present penile discharge with cuts on his penis. Pt states symptom started two weeks ago.pt prefer a male Physician to examine him.

## 2020-10-04 NOTE — Discharge Instructions (Addendum)
Please start valacyclovir three times daily to address what I believe is a genital herpes infection.  Avoid all forms of sexual intercourse (oral, vaginal, anal) for the next 7 days to avoid spreading/reinfecting. Return if symptoms worsen/do not resolve, you develop fever, abdominal pain, blood in your urine, or are re-exposed to an STI.

## 2020-10-05 LAB — RPR: RPR Ser Ql: NONREACTIVE

## 2020-10-05 LAB — CYTOLOGY, (ORAL, ANAL, URETHRAL) ANCILLARY ONLY
Chlamydia: POSITIVE — AB
Comment: NEGATIVE
Comment: NEGATIVE
Comment: NORMAL
Neisseria Gonorrhea: NEGATIVE
Trichomonas: NEGATIVE

## 2020-10-06 ENCOUNTER — Telehealth (HOSPITAL_COMMUNITY): Payer: Self-pay | Admitting: Emergency Medicine

## 2020-10-06 MED ORDER — DOXYCYCLINE HYCLATE 100 MG PO CAPS: 100.0000 mg | ORAL_CAPSULE | Freq: Two times a day (BID) | ORAL | 0 refills | Status: AC

## 2020-10-07 LAB — HSV CULTURE AND TYPING

## 2020-10-25 ENCOUNTER — Encounter (HOSPITAL_COMMUNITY): Payer: Self-pay

## 2020-10-25 ENCOUNTER — Ambulatory Visit (HOSPITAL_COMMUNITY)
Admission: RE | Admit: 2020-10-25 | Discharge: 2020-10-25 | Disposition: A | Discharge status: Home / Self Care | Payer: Medicaid Other | Source: Ambulatory Visit | Attending: Emergency Medicine | Admitting: Emergency Medicine

## 2020-10-25 VITALS — BP 132/55 | HR 66 | Temp 98.9°F | Resp 16

## 2020-10-25 DIAGNOSIS — Z760 Encounter for issue of repeat prescription: Secondary | ICD-10-CM

## 2020-10-25 DIAGNOSIS — A749 Chlamydial infection, unspecified: Secondary | ICD-10-CM | POA: Diagnosis not present

## 2020-10-25 MED ORDER — DOXYCYCLINE HYCLATE 100 MG PO CAPS: 100.0000 mg | ORAL_CAPSULE | Freq: Two times a day (BID) | ORAL | 0 refills | Status: AC

## 2020-10-25 NOTE — ED Provider Notes (Signed)
[facility]    CSN: [id number] Arrival date & time: 10/25/20  1248      History   Chief Complaint Chief Complaint  Patient presents with  . Medication Refill    HPI Jorge Day is a 20 y.o. male.   Patient here for STI treatment.  Reports he was seen at the beginning of the month and tested positive for chlamydia.  Reports being unable to pick up prescription and has not had any treatment.  Requesting "a shot" to treat.  Denies any other concerns for STI.  Denies any penile pain or discharge.  Denies any dysuria, urgency, or frequency.  Denies any trauma, injury, or other precipitating event.  Denies any specific alleviating or aggravating factors.  Denies any fevers, chest pain, shortness of breath, N/V/D, numbness, tingling, weakness, abdominal pain, or headaches.    The history is provided by the patient.    Past Medical History:  Diagnosis Date  . ADHD (attention deficit hyperactivity disorder)   . Anxiety   . 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 Asthma   . Headache   . History of ADHD 04/16/2015  . Intellectual disability 04/16/2015  . 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 Influenza vaccination declined 04/12/2016  . 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 Intellectual disability 04/16/2015  . Oppositional defiant disorder 04/15/2015  . Family circumstance 12/16/2014  . Psychosocial stressors 12/16/2014  . Mild persistent asthma 10/29/2013  . Seasonal allergies 10/29/2013    Past Surgical History:   Procedure Laterality Date  . CLOSED REDUCTION FINGER WITH PERCUTANEOUS PINNING Right 07/23/2016   Procedure: CLOSED REDUCTION PERCUTANEOUS PINNING OF RIGHT SMALL METACARPAL FRACTURE;  Surgeon: Dairl Ponder, MD;  Location: [facility] OR;  Service: Orthopedics;  Laterality: Right;  . TYMPANOSTOMY TUBE PLACEMENT         Home Medications    Prior to Admission medications   Medication Sig Start Date End Date Taking? Authorizing Provider  doxycycline (VIBRAMYCIN) 100 MG capsule Take 1 capsule (100 mg total) by mouth 2 (two) times daily for 7 days. 10/25/20 11/01/20 Yes Ivette Loyal, NP  albuterol (VENTOLIN HFA) 108 (90 Base) MCG/ACT inhaler Inhale 2 puffs into the lungs every 6 (six) hours as needed for wheezing or shortness of breath.  10/19/15   [provider]  CONCERTA 18 MG CR tablet Take 18 mg by mouth every morning. 12/24/18   [provider]  hydrOXYzine (ATARAX/VISTARIL) 50 MG tablet Take 1 tablet (50 mg total) by mouth at bedtime. 12/27/18   Leata Mouse, MD  ibuprofen (ADVIL) 800 MG tablet Take 1 tablet (800 mg total) by mouth 3 (three) times daily. 07/18/19   Eustace Moore, MD  Olopatadine HCl 0.2 % SOLN Apply 1 drop to eye daily. 04/13/20   Mardella Layman, MD  ondansetron (ZOFRAN ODT) 4 MG disintegrating tablet Take 1 tablet (4 mg total) by mouth every 8 (eight) hours as needed for nausea or vomiting. 02/07/20   Harlene Salts A, PA-C  paliperidone (INVEGA) 6 MG 24 hr tablet Take 6 mg by mouth daily. 12/18/19   [provider]  tobramycin (TOBREX) 0.3 %  ophthalmic solution Place 1 drop into the right eye every 6 (six) hours. 04/13/20   Mardella Layman, MD  traZODone (DESYREL) 50 MG tablet Take 50 mg by mouth at bedtime as needed for sleep. 05/14/19   [provider]  valACYclovir (VALTREX) 1000 MG tablet Take 1 tablet (1,000 mg total) by mouth 3 (three) times daily. 10/04/20   Wallis Bamberg, PA-C  Cetirizine HCl 10 MG CAPS Take 1 capsule (10 mg  total) by mouth daily. 02/27/19 04/13/20  Wieters, Hallie C, PA-C  dicyclomine (BENTYL) 20 MG tablet Take 1 tablet (20 mg total) by mouth 4 (four) times daily -  before meals and at bedtime. 02/27/19 07/18/19  Wieters, Hallie C, PA-C  fluticasone (FLONASE) 50 MCG/ACT nasal spray Place 1 spray into both nostrils daily. Patient taking differently: Place 1 spray into both nostrils daily as needed for allergies.  05/12/19 04/13/20  Avegno, Zachery Dakins, FNP  OLANZapine (ZYPREXA) 15 MG tablet Take 15 mg by mouth at bedtime. Patient not taking: Reported on 02/07/2020 06/12/19 04/13/20  [provider]  omeprazole (PRILOSEC) 20 MG capsule Take 1 capsule (20 mg total) by mouth daily. 01/29/20 04/13/20  Georgetta Haber, NP    Family History Family History  Problem Relation Age of Onset  . Depression Mother   . Hypertension Mother   . Asthma Father   . Alcohol abuse Father   . Mental illness Brother   . Mental illness Maternal Aunt   . Cancer Maternal Grandmother   . Brain cancer Maternal Grandmother     Social History Social History   Tobacco Use  . Smoking status: Passive Smoke Exposure - Never Smoker  . Smokeless tobacco: Never Used  Substance Use Topics  . Alcohol use: No  . Drug use: No     Allergies   Patient has no known allergies.   Review of Systems Review of Systems  All other systems reviewed and are negative.    Physical Exam Triage Vital Signs ED Triage Vitals  Enc Vitals Group     BP 10/25/20 1322 (!) 132/55     Pulse Rate 10/25/20 1322 66     Resp 10/25/20 1322 16     Temp 10/25/20 1322 98.9 F (37.2 C)     Temp Source 10/25/20 1322 Oral     SpO2 10/25/20 1322 100 %     Weight --      Height --      Head Circumference --      Peak Flow --      Pain Score 10/25/20 1321 0     Pain Loc --      Pain Edu? --      Excl. in GC? --    No data found.  Updated Vital Signs BP (!) 132/55 (BP Location: Left Arm)   Pulse 66   Temp 98.9 F (37.2 C) (Oral)    Resp 16   SpO2 100%   Visual Acuity Right Eye Distance:   Left Eye Distance:   Bilateral Distance:    Right Eye Near:   Left Eye Near:    Bilateral Near:     Physical Exam Vitals and nursing note reviewed.  Constitutional:      General: He is not in acute distress.    Appearance: Normal appearance. He is not ill-appearing, toxic-appearing or diaphoretic.  HENT:     Head: Normocephalic and atraumatic.  Eyes:     Conjunctiva/sclera: Conjunctivae normal.  Cardiovascular:     Rate  and Rhythm: Normal rate.     Pulses: Normal pulses.  Pulmonary:     Effort: Pulmonary effort is normal.  Abdominal:     General: Abdomen is flat.  Genitourinary:    Comments: declines Musculoskeletal:        General: Normal range of motion.     Cervical back: Normal range of motion.  Skin:    General: Skin is warm and dry.  Neurological:     General: No focal deficit present.     Mental Status: He is alert and oriented to person, place, and time.  Psychiatric:        Mood and Affect: Mood normal.      UC Treatments / Results  Labs (all labs ordered are listed, but only abnormal results are displayed) Labs Reviewed - No data to display  EKG   Radiology No results found.  Procedures Procedures (including critical care time)  Medications Ordered in UC Medications - No data to display  Initial Impression / Assessment and Plan / UC Course  I have reviewed the triage vital signs and the nursing notes.  Pertinent labs & imaging results that were available during my care of the patient were reviewed by me and considered in my medical decision making (see chart for details).    Assessment negative for red flags or concerns.  STI screen 10/04/2020 positive for chlamydia but otherwise negative.  Patient requesting a shot for treatment but discussed that doxycycline is the recommended treatment chlamydia and therefore the best option. Doxycycline twice a day for the next 7 days sent in.   Discussed safe sex practices including condom or other barrier method use.  Final Clinical Impressions(s) / UC Diagnoses   Final diagnoses:  Medication refill  Chlamydia     Discharge Instructions     Take the doxycycline twice a day for 7 days.  Take all of the pills.    Do not have sex while taking undergoing treatment for STI.  Make sure that all of your partners get tested and treated.   Use a condom or other barrier method for all sexual encounters.    Return or go to the Emergency Department if symptoms worsen or do not improve in the next few days.      ED Prescriptions    Medication Sig Dispense Auth. Provider   doxycycline (VIBRAMYCIN) 100 MG capsule Take 1 capsule (100 mg total) by mouth 2 (two) times daily for 7 days. 14 capsule Ivette Loyal, NP     PDMP not reviewed this encounter.   Ivette Loyal, NP 10/25/20 [phone]

## 2020-10-25 NOTE — Discharge Instructions (Addendum)
Take the doxycycline twice a day for 7 days.  Take all of the pills.    Do not have sex while taking undergoing treatment for STI.  Make sure that all of your partners get tested and treated.   Use a condom or other barrier method for all sexual encounters.    Return or go to the Emergency Department if symptoms worsen or do not improve in the next few days.

## 2020-10-25 NOTE — ED Triage Notes (Signed)
Pt need a refill on his medication, pt states he never started the medicines and is still positive with Chlamydia.

## 2021-02-12 ENCOUNTER — Ambulatory Visit (HOSPITAL_COMMUNITY)
Admission: RE | Admit: 2021-02-12 | Discharge: 2021-02-12 | Disposition: A | Discharge status: Home / Self Care | Payer: Medicaid Other | Source: Ambulatory Visit | Attending: Family Medicine | Admitting: Family Medicine

## 2021-02-12 ENCOUNTER — Encounter (HOSPITAL_COMMUNITY): Payer: Self-pay

## 2021-02-12 ENCOUNTER — Other Ambulatory Visit: Payer: Self-pay

## 2021-02-12 VITALS — BP 114/70 | HR 60 | Temp 99.0°F | Resp 20

## 2021-02-12 DIAGNOSIS — Z20822 Contact with and (suspected) exposure to covid-19: Secondary | ICD-10-CM | POA: Diagnosis not present

## 2021-02-12 DIAGNOSIS — J029 Acute pharyngitis, unspecified: Secondary | ICD-10-CM

## 2021-02-12 DIAGNOSIS — Z79899 Other long term (current) drug therapy: Secondary | ICD-10-CM | POA: Insufficient documentation

## 2021-02-12 DIAGNOSIS — L0100 Impetigo, unspecified: Secondary | ICD-10-CM | POA: Diagnosis not present

## 2021-02-12 LAB — POCT RAPID STREP A, ED / UC: Streptococcus, Group A Screen (Direct): NEGATIVE

## 2021-02-12 MED ORDER — LIDOCAINE VISCOUS HCL 2 % MT SOLN: 10.0000 mL | OROMUCOSAL | 0 refills | Status: DC | PRN

## 2021-02-12 MED ORDER — MUPIROCIN 2 % EX OINT: 1.0000 "application " | TOPICAL_OINTMENT | Freq: Two times a day (BID) | CUTANEOUS | 0 refills | Status: DC

## 2021-02-12 NOTE — ED Provider Notes (Signed)
[facility]    CSN: [id number] Arrival date & time: 02/12/21  1250      History   Chief Complaint Chief Complaint  Patient presents with   Appointment   Sore Throat    HPI Jorge Day is a 20 y.o. male.   Presenting today with 3-day history of sore, swollen throat, malaise, chills.  Denies known fever, cough, chest pain, shortness of breath, abdominal pain, nausea vomiting or diarrhea.  Has not been tried anything over-the-counter for symptoms at this time.  Several sick contacts at work.  Also complains of waking up with a painful bump on the side of his nose has been slightly crusted.  Put Neosporin on it this morning.  Is not aware of any injury to the area.    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   Influenza vaccination declined 04/12/2016   ADHD (attention deficit hyperactivity disorder), 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   Intellectual disability 04/16/2015   Oppositional defiant disorder 04/15/2015   Family circumstance 12/16/2014   Psychosocial stressors 12/16/2014   Mild persistent asthma 10/29/2013   Seasonal allergies 10/29/2013    Past Surgical History:  Procedure Laterality Date   CLOSED REDUCTION FINGER WITH PERCUTANEOUS PINNING Right 07/23/2016   Procedure: CLOSED REDUCTION PERCUTANEOUS PINNING OF RIGHT SMALL METACARPAL  FRACTURE;  Surgeon: Dairl Ponder, MD;  Location: [facility];  Service: Orthopedics;  Laterality: Right;   TYMPANOSTOMY TUBE PLACEMENT         Home Medications    Prior to Admission medications   Medication Sig Start Date End Date Taking? Authorizing Provider  lidocaine (XYLOCAINE) 2 % solution Use as directed 10 mLs in the mouth or throat as needed for mouth pain. 02/12/21  Yes Particia Nearing, PA-C  mupirocin ointment (BACTROBAN) 2 % Apply 1 application topically 2 (two) times daily. 02/12/21  Yes Particia Nearing, PA-C  albuterol (VENTOLIN HFA) 108 (90 Base) MCG/ACT inhaler Inhale 2 puffs into the lungs every 6 (six) hours as needed for wheezing or shortness of breath.  10/19/15   [provider]  CONCERTA 18 MG CR tablet Take 18 mg by mouth every morning. 12/24/18   [provider]  hydrOXYzine (ATARAX/VISTARIL) 50 MG tablet Take 1 tablet (50 mg total) by mouth at bedtime. 12/27/18   Leata Mouse, MD  ibuprofen (ADVIL) 800 MG tablet Take 1 tablet (800 mg total) by mouth 3 (three) times daily. 07/18/19   Eustace Moore, MD  Olopatadine HCl 0.2 % SOLN Apply 1 drop to eye daily. 04/13/20   Mardella Layman, MD  ondansetron (ZOFRAN ODT) 4 MG disintegrating tablet Take 1 tablet (4 mg total) by mouth every 8 (eight) hours as needed for nausea or vomiting. 02/07/20   Harlene Salts A, PA-C  paliperidone (INVEGA) 6 MG 24 hr tablet Take 6 mg by mouth daily. 12/18/19   [provider]  tobramycin (TOBREX) 0.3 %  ophthalmic solution Place 1 drop into the right eye every 6 (six) hours. 04/13/20   Mardella Layman, MD  traZODone (DESYREL) 50 MG tablet Take 50 mg by mouth at bedtime as needed for sleep. 05/14/19   [provider]  valACYclovir (VALTREX) 1000 MG tablet Take 1 tablet (1,000 mg total) by mouth 3 (three) times daily. 10/04/20   Wallis Bamberg, PA-C  Cetirizine HCl 10 MG CAPS Take 1 capsule (10 mg total) by mouth daily. 02/27/19 04/13/20  Wieters, Hallie  C, PA-C  dicyclomine (BENTYL) 20 MG tablet Take 1 tablet (20 mg total) by mouth 4 (four) times daily -  before meals and at bedtime. 02/27/19 07/18/19  Wieters, Hallie C, PA-C  fluticasone (FLONASE) 50 MCG/ACT nasal spray Place 1 spray into both nostrils daily. Patient taking differently: Place 1 spray into both nostrils daily as needed for allergies.  05/12/19 04/13/20  Avegno, Zachery Dakins, FNP  OLANZapine (ZYPREXA) 15 MG tablet Take 15 mg by mouth at bedtime. Patient not taking: Reported on 02/07/2020 06/12/19 04/13/20  [provider]  omeprazole (PRILOSEC) 20 MG capsule Take 1 capsule (20 mg total) by mouth daily. 01/29/20 04/13/20  Georgetta Haber, NP    Family History Family History  Problem Relation Age of Onset   Depression Mother    Hypertension Mother    Asthma Father    Alcohol abuse Father    Mental illness Brother    Mental illness Maternal Aunt    Cancer Maternal Grandmother    Brain cancer Maternal Grandmother     Social History Social History   Tobacco Use   Smoking status: Passive Smoke Exposure - Never Smoker   Smokeless tobacco: Never  Substance Use Topics   Alcohol use: No   Drug use: No     Allergies   Patient has no known allergies.   Review of Systems Review of Systems Per HPI  Physical Exam Triage Vital Signs ED Triage Vitals [02/12/21 1316]  Enc Vitals Group     BP 114/70     Pulse Rate 60     Resp 20     Temp 99 F (37.2 C)     Temp Source Oral     SpO2 98 %     Weight      Height      Head Circumference      Peak Flow      Pain Score 3     Pain Loc      Pain Edu?      Excl. in GC?    No data found.  Updated Vital Signs BP 114/70 (BP Location: Left Arm)   Pulse 60   Temp 99 F (37.2 C) (Oral)   Resp 20   SpO2 98%   Visual Acuity Right Eye Distance:   Left Eye Distance:   Bilateral Distance:    Right Eye Near:   Left Eye Near:    Bilateral Near:     Physical Exam Vitals and nursing note reviewed.   Constitutional:      Appearance: Normal appearance.  HENT:     Head: Atraumatic.     Right Ear: Tympanic membrane normal.     Left Ear: Tympanic membrane normal.     Nose: Nose normal.     Mouth/Throat:     Mouth: Mucous membranes are moist.     Pharynx: Posterior oropharyngeal erythema present. No oropharyngeal exudate.     Comments: Bilateral tonsillar erythema, edema.  Uvula midline, oral airway  patent Eyes:     Extraocular Movements: Extraocular movements intact.     Conjunctiva/sclera: Conjunctivae normal.  Cardiovascular:     Rate and Rhythm: Normal rate and regular rhythm.  Pulmonary:     Effort: Pulmonary effort is normal.     Breath sounds: Normal breath sounds. No wheezing or rales.  Abdominal:     General: Bowel sounds are normal. There is no distension.     Palpations: Abdomen is soft.     Tenderness: There is no abdominal tenderness. There is no right CVA tenderness or guarding.  Musculoskeletal:        General: Normal range of motion.     Cervical back: Normal range of motion and neck supple.  Skin:    General: Skin is warm and dry.  Neurological:     General: No focal deficit present.     Mental Status: He is oriented to person, place, and time.  Psychiatric:        Mood and Affect: Mood normal.        Thought Content: Thought content normal.        Judgment: Judgment normal.   UC Treatments / Results  Labs (all labs ordered are listed, but only abnormal results are displayed) Labs Reviewed  CULTURE, GROUP A STREP ([facility])  SARS CORONAVIRUS 2 (TAT 6-24 HRS)  POCT RAPID STREP A, ED / UC   EKG  Radiology No results found.  Procedures Procedures (including critical care time)  Medications Ordered in UC Medications - No data to display  Initial Impression / Assessment and Plan / UC Course  I have reviewed the triage vital signs and the nursing notes.  Pertinent labs & imaging results that were available during my care of the patient were reviewed  by me and considered in my medical decision making (see chart for details).     Vitals and exam reassuring, rapid strep negative, throat culture and COVID test pending.  We will treat with viscous lidocaine, over-the-counter supportive medications and home care.  Quarantine reviewed, work note given.  We will treat for impetigo on the nasal lesion with mupirocin.  Discussed good wound care and return precautions. Final Clinical Impressions(s) / UC Diagnoses   Final diagnoses:  Pharyngitis, unspecified etiology  Impetigo   Discharge Instructions   None    ED Prescriptions     Medication Sig Dispense Auth. Provider   mupirocin ointment (BACTROBAN) 2 % Apply 1 application topically 2 (two) times daily. 22 g Particia Nearing, PA-C   lidocaine (XYLOCAINE) 2 % solution Use as directed 10 mLs in the mouth or throat as needed for mouth pain. 100 mL Particia Nearing, [place]      PDMP not reviewed this encounter.   Particia Nearing, [place] 02/12/21 [phone]

## 2021-02-12 NOTE — ED Triage Notes (Signed)
Pt presents with a sore throat X 3 days. States he has painful swallowing. C/o a bump on the left side of his nose.

## 2021-02-13 LAB — SARS CORONAVIRUS 2 (TAT 6-24 HRS): SARS Coronavirus 2: NEGATIVE

## 2021-02-15 ENCOUNTER — Telehealth (HOSPITAL_COMMUNITY): Payer: Self-pay | Admitting: Emergency Medicine

## 2021-02-15 LAB — CULTURE, GROUP A STREP (THRC)

## 2021-02-15 MED ORDER — PENICILLIN V POTASSIUM 500 MG PO TABS: 500.0000 mg | ORAL_TABLET | Freq: Two times a day (BID) | ORAL | 0 refills | Status: AC

## 2021-02-27 IMAGING — CT CT ABD-PELV W/ CM
2 of 4 series · 16 of 46 positions shown, 18 images · IV contrast (APPLIED)
Comparison: None.

CLINICAL DATA: Lower abdominal pain and vomiting since 09/05/2019.

EXAM:
CT ABDOMEN AND PELVIS WITH CONTRAST
TECHNIQUE: Multidetector CT imaging of the abdomen and pelvis was performed
using the standard protocol following bolus administration of
intravenous contrast.
CONTRAST:  100mL OMNIPAQUE IOHEXOL 300 MG/ML  SOLN

[Series 3: abd/ pelvis 5.0 i30f 2 · axial · 0.75mm/px · z∈[+719,+1154]mm · 13 of 95 slices shown, 15 images]
[im 4/95  soft-tissue]
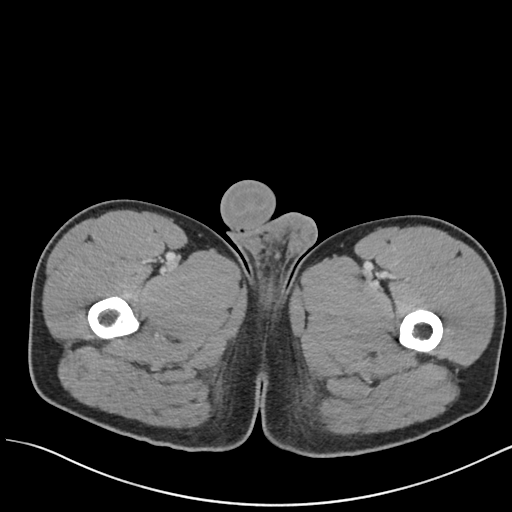
[im 4/95  bone]
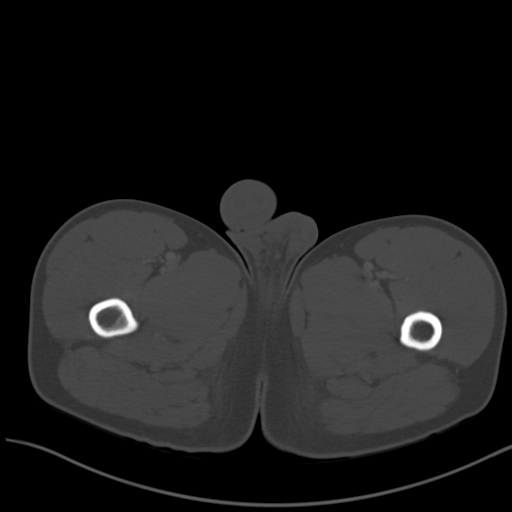
[im 12/95  soft-tissue]
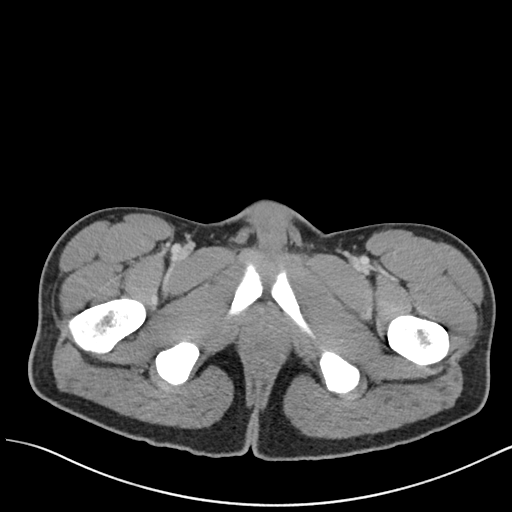
[im 19/95  soft-tissue]
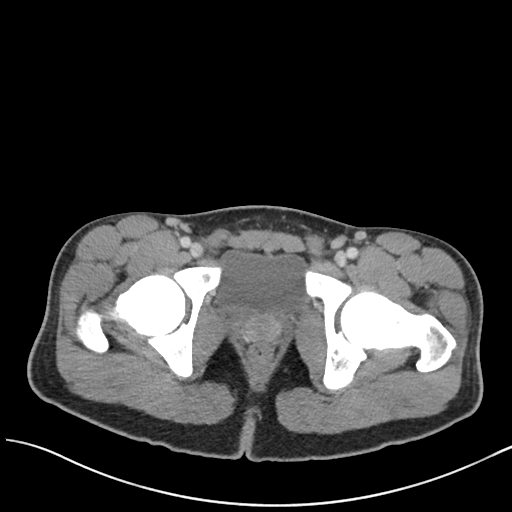
[im 27/95  soft-tissue]
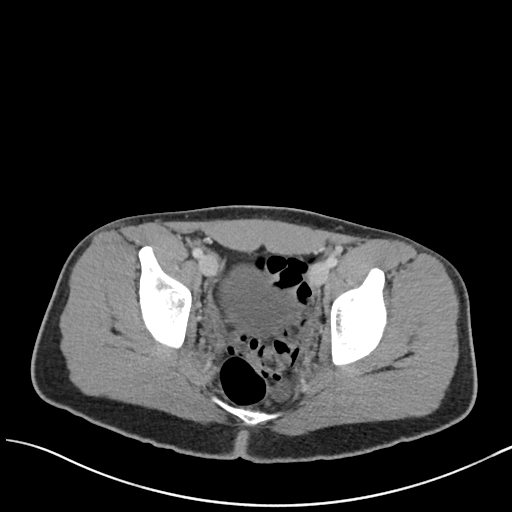
[im 34/95  soft-tissue]
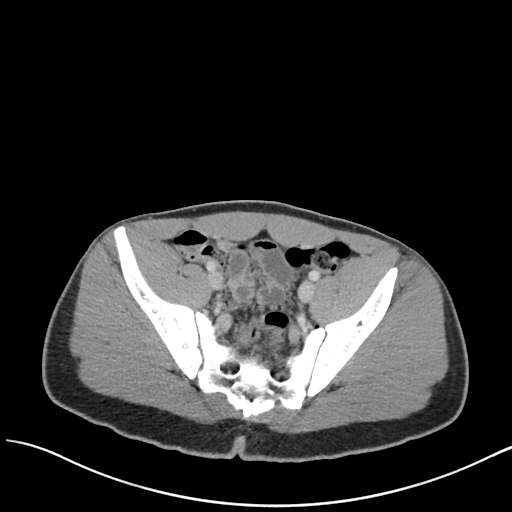
[im 42/95  soft-tissue]
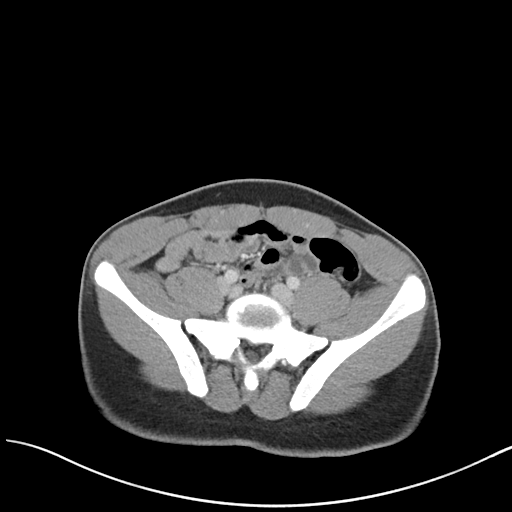
[im 49/95  soft-tissue]
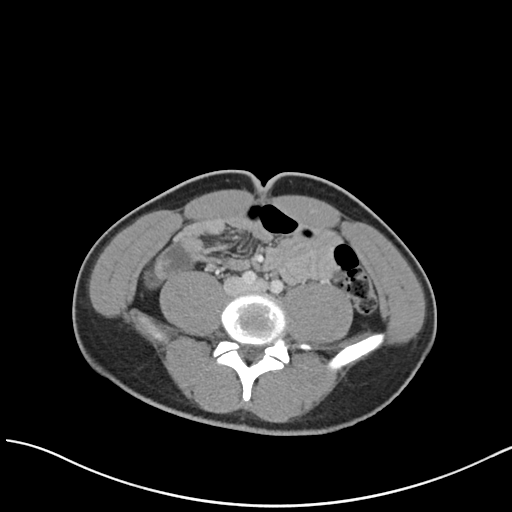
[im 53/95  soft-tissue]
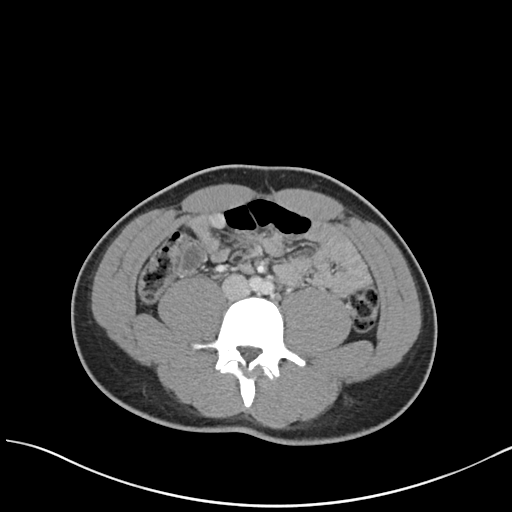
[im 61/95  soft-tissue]
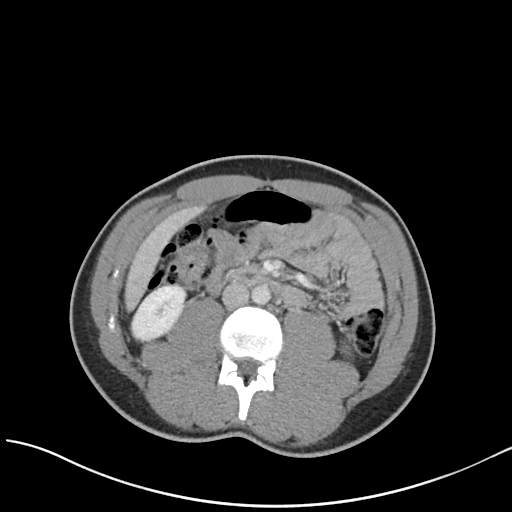
[im 61/95  bone]
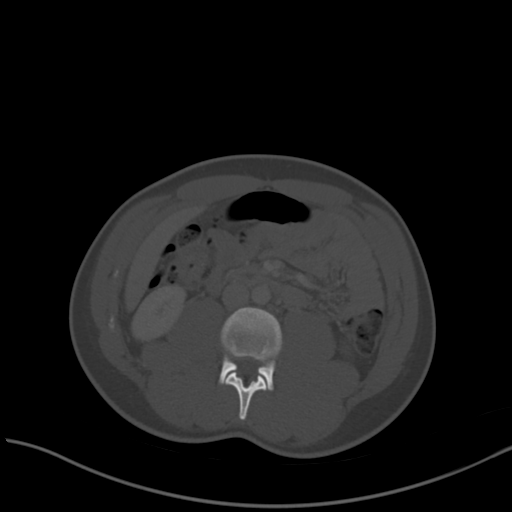
[im 68/95  soft-tissue]
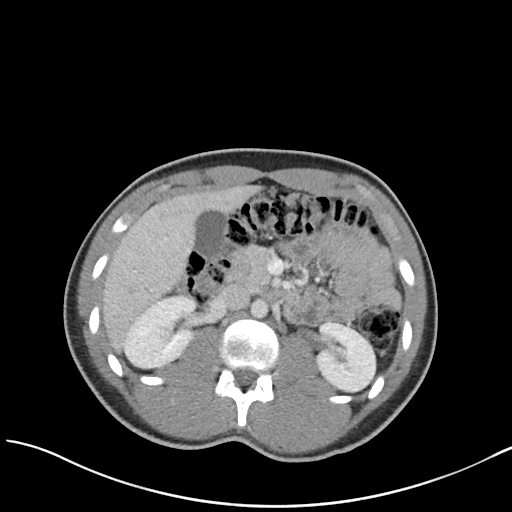
[im 76/95  soft-tissue]
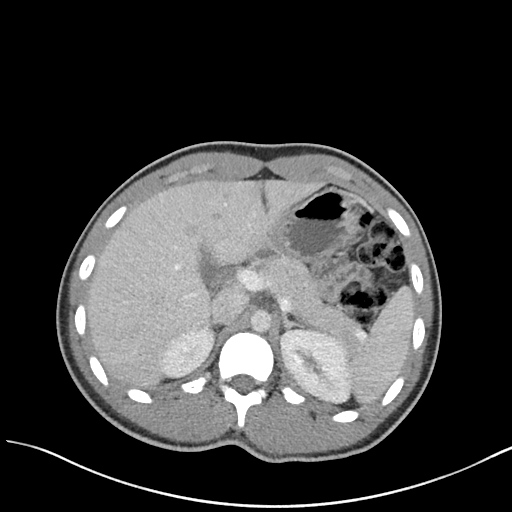
[im 83/95  soft-tissue]
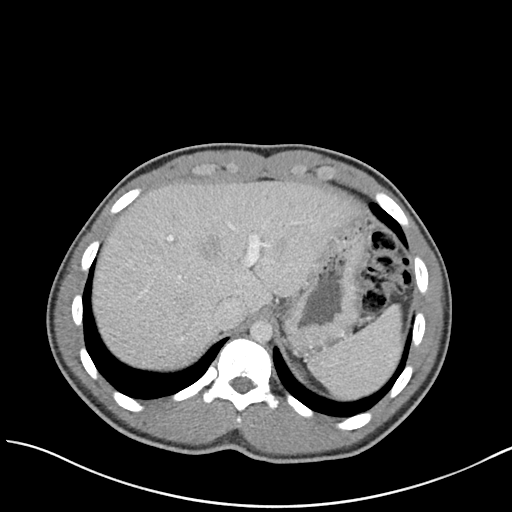
[im 91/95  soft-tissue]
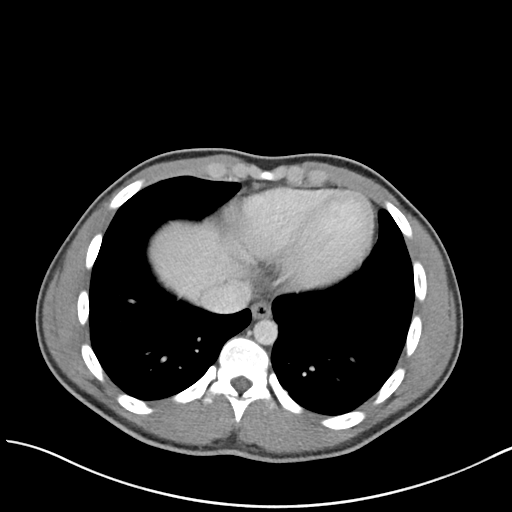

[Series 6: coronal soft tissue · coronal · 0.66mm/px · 3 of 101 slices shown]
[im 34/101  soft-tissue]
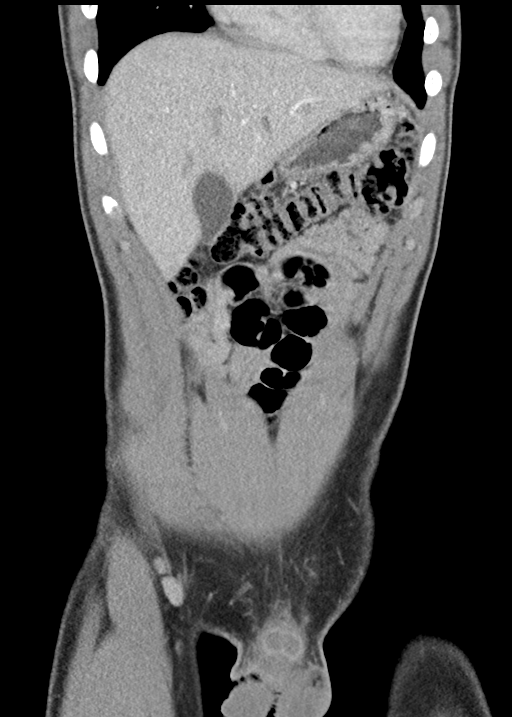
[im 45/101  soft-tissue]
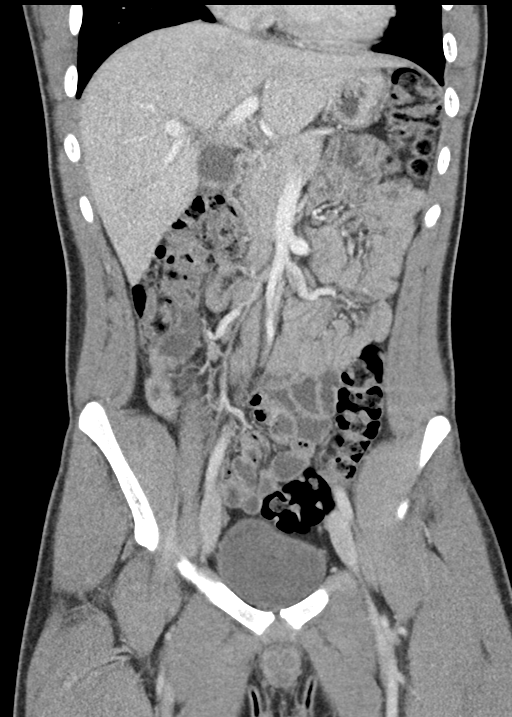
[im 56/101  soft-tissue]
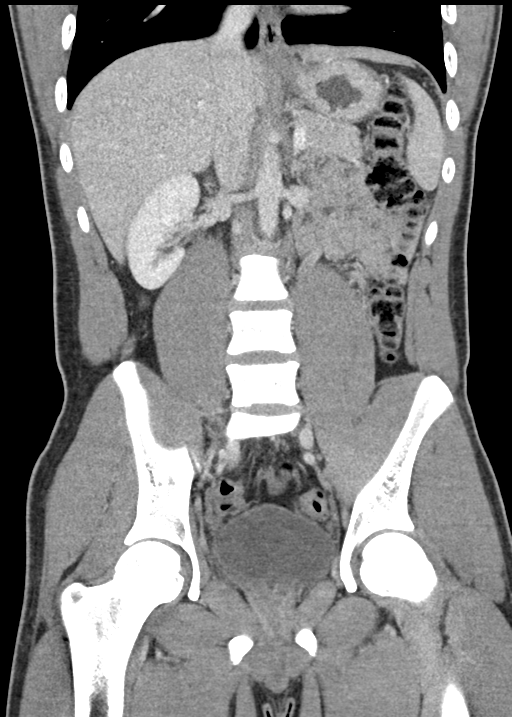

[16 of 46 positions shown; findings below may reference images not displayed]

FINDINGS: Lower chest: Normal.

Hepatobiliary: No focal liver abnormality is seen. No gallstones,
gallbladder wall thickening, or biliary dilatation.

Pancreas: Unremarkable. No pancreatic ductal dilatation or
surrounding inflammatory changes.

Spleen: Normal in size without focal abnormality.

Adrenals/Urinary Tract: Adrenal glands are unremarkable. Kidneys are
normal, without renal calculi, focal lesion, or hydronephrosis.
Bladder is unremarkable.

Stomach/Bowel: Stomach is within normal limits. Appendix appears
normal. No evidence of bowel wall thickening, distention, or
inflammatory changes.

Vascular/Lymphatic: No significant vascular findings are present. No
enlarged abdominal or pelvic lymph nodes.

Reproductive: Prostate is unremarkable.

Other: No abdominal wall hernia or abnormality. No abdominopelvic
ascites.

Musculoskeletal: No acute or significant osseous findings.
IMPRESSION: Normal exam.

## 2021-04-02 ENCOUNTER — Ambulatory Visit (HOSPITAL_COMMUNITY)
Admission: RE | Admit: 2021-04-02 | Discharge: 2021-04-02 | Disposition: A | Discharge status: Home / Self Care | Payer: Medicaid Other | Source: Ambulatory Visit | Attending: Emergency Medicine | Admitting: Emergency Medicine

## 2021-04-02 ENCOUNTER — Encounter (HOSPITAL_COMMUNITY): Payer: Self-pay

## 2021-04-02 ENCOUNTER — Other Ambulatory Visit: Payer: Self-pay

## 2021-04-02 VITALS — BP 119/69 | HR 62 | Temp 99.2°F | Resp 16

## 2021-04-02 DIAGNOSIS — R300 Dysuria: Secondary | ICD-10-CM

## 2021-04-02 DIAGNOSIS — Z113 Encounter for screening for infections with a predominantly sexual mode of transmission: Secondary | ICD-10-CM

## 2021-04-02 LAB — POCT URINALYSIS DIPSTICK, ED / UC
Bilirubin Urine: NEGATIVE
Glucose, UA: NEGATIVE mg/dL
Hgb urine dipstick: NEGATIVE
Ketones, ur: NEGATIVE mg/dL
Leukocytes,Ua: NEGATIVE
Nitrite: NEGATIVE
Protein, ur: NEGATIVE mg/dL
Specific Gravity, Urine: 1.025 (ref 1.005–1.030)
Urobilinogen, UA: 2 mg/dL — ABNORMAL HIGH (ref 0.0–1.0)
pH: 7 (ref 5.0–8.0)

## 2021-04-02 LAB — HIV ANTIBODY (ROUTINE TESTING W REFLEX): HIV Screen 4th Generation wRfx: NONREACTIVE

## 2021-04-02 NOTE — ED Provider Notes (Signed)
[facility]    CSN: [id number] Arrival date & time: 04/02/21  1548      History   Chief Complaint Chief Complaint  Patient presents with   appt 1600    HPI Jorge Day is a 20 y.o. male.   Patient here for evaluation of dysuria as well as an STD screening.  Reports partner recently informed him that they tested positive for chlamydia.  Patient denies any discharge or rashes.  Denies any urinary frequency urgency or flank pain.  Has not taken any OTC medications or treatments.  Denies any trauma, injury, or other precipitating event.  Denies any specific alleviating or aggravating factors.  Denies any fevers, chest pain, shortness of breath, N/V/D, numbness, tingling, weakness, abdominal pain, or headaches.    The history is provided by the patient.   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   Influenza vaccination declined 04/12/2016   ADHD (attention deficit hyperactivity disorder), 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   Intellectual disability 04/16/2015   Oppositional defiant disorder 04/15/2015   Family circumstance 12/16/2014   Psychosocial stressors 12/16/2014   Mild persistent asthma 10/29/2013   Seasonal allergies 10/29/2013    Past Surgical History:  Procedure Laterality Date   CLOSED REDUCTION FINGER WITH PERCUTANEOUS PINNING Right  07/23/2016   Procedure: CLOSED REDUCTION PERCUTANEOUS PINNING OF RIGHT SMALL METACARPAL FRACTURE;  Surgeon: Dairl Ponder, MD;  Location: [facility];  Service: Orthopedics;  Laterality: Right;   TYMPANOSTOMY TUBE PLACEMENT         Home Medications    Prior to Admission medications   Medication Sig Start Date End Date Taking? Authorizing Provider  albuterol (VENTOLIN HFA) 108 (90 Base) MCG/ACT inhaler Inhale 2 puffs into the lungs every 6 (six) hours as needed for wheezing or shortness of breath.  10/19/15   [provider]  CONCERTA 18 MG CR tablet Take 18 mg by mouth every morning. 12/24/18   [provider]  hydrOXYzine (ATARAX/VISTARIL) 50 MG tablet Take 1 tablet (50 mg total) by mouth at bedtime. 12/27/18   Leata Mouse, MD  ibuprofen (ADVIL) 800 MG tablet Take 1 tablet (800 mg total) by mouth 3 (three) times daily. 07/18/19   Eustace Moore, MD  lidocaine (XYLOCAINE) 2 % solution Use as directed 10 mLs in the mouth or throat as needed for mouth pain. 02/12/21   Particia Nearing, PA-C  mupirocin ointment (BACTROBAN) 2 % Apply 1 application topically 2 (two) times daily. 02/12/21   Particia Nearing, PA-C  Olopatadine HCl 0.2 % SOLN Apply 1 drop to eye daily. 04/13/20   Mardella Layman, MD  ondansetron (ZOFRAN ODT) 4 MG disintegrating tablet Take 1 tablet (4 mg total) by mouth every 8 (eight) hours as needed for nausea or vomiting. 02/07/20   Harlene Salts A, PA-C  paliperidone (INVEGA) 6 MG 24 hr tablet Take 6 mg by mouth daily. 12/18/19  [provider]  tobramycin (TOBREX) 0.3 % ophthalmic solution Place 1 drop into the right eye every 6 (six) hours. 04/13/20   Mardella Layman, MD  traZODone (DESYREL) 50 MG tablet Take 50 mg by mouth at bedtime as needed for sleep. 05/14/19   [provider]  valACYclovir (VALTREX) 1000 MG tablet Take 1 tablet (1,000 mg total) by mouth 3 (three) times daily. 10/04/20   Wallis Bamberg, PA-C  Cetirizine HCl 10 MG  CAPS Take 1 capsule (10 mg total) by mouth daily. 02/27/19 04/13/20  Wieters, Hallie C, PA-C  dicyclomine (BENTYL) 20 MG tablet Take 1 tablet (20 mg total) by mouth 4 (four) times daily -  before meals and at bedtime. 02/27/19 07/18/19  Wieters, Hallie C, PA-C  fluticasone (FLONASE) 50 MCG/ACT nasal spray Place 1 spray into both nostrils daily. Patient taking differently: Place 1 spray into both nostrils daily as needed for allergies.  05/12/19 04/13/20  Avegno, Zachery Dakins, FNP  OLANZapine (ZYPREXA) 15 MG tablet Take 15 mg by mouth at bedtime. Patient not taking: Reported on 02/07/2020 06/12/19 04/13/20  [provider]  omeprazole (PRILOSEC) 20 MG capsule Take 1 capsule (20 mg total) by mouth daily. 01/29/20 04/13/20  Georgetta Haber, NP    Family History Family History  Problem Relation Age of Onset   Depression Mother    Hypertension Mother    Asthma Father    Alcohol abuse Father    Mental illness Brother    Mental illness Maternal Aunt    Cancer Maternal Grandmother    Brain cancer Maternal Grandmother     Social History Social History   Tobacco Use   Smoking status: Passive Smoke Exposure - Never Smoker   Smokeless tobacco: Never  Substance Use Topics   Alcohol use: No   Drug use: No     Allergies   Patient has no known allergies.   Review of Systems Review of Systems  Genitourinary:  Positive for dysuria.  All other systems reviewed and are negative.   Physical Exam Triage Vital Signs ED Triage Vitals [04/02/21 1628]  Enc Vitals Group     BP 119/69     Pulse Rate 62     Resp 16     Temp 99.2 F (37.3 C)     Temp Source Oral     SpO2 99 %     Weight      Height      Head Circumference      Peak Flow      Pain Score 4     Pain Loc      Pain Edu?      Excl. in GC?    No data found.  Updated Vital Signs BP 119/69 (BP Location: Right Arm)   Pulse 62   Temp 99.2 F (37.3 C) (Oral)   Resp 16   SpO2 99%   Visual Acuity Right Eye Distance:   Left  Eye Distance:   Bilateral Distance:    Right Eye Near:   Left Eye Near:    Bilateral Near:     Physical Exam Vitals and nursing note reviewed.  Constitutional:      General: He is not in acute distress.    Appearance: Normal appearance. He is not ill-appearing, toxic-appearing or diaphoretic.  HENT:     Head: Normocephalic and atraumatic.  Eyes:     Conjunctiva/sclera: Conjunctivae normal.  Cardiovascular:     Rate and Rhythm: Normal rate.     Pulses:  Normal pulses.  Pulmonary:     Effort: Pulmonary effort is normal.  Abdominal:     General: Abdomen is flat.  Genitourinary:    Comments: declines Musculoskeletal:        General: Normal range of motion.     Cervical back: Normal range of motion.  Skin:    General: Skin is warm and dry.  Neurological:     General: No focal deficit present.     Mental Status: He is alert and oriented to person, place, and time.  Psychiatric:        Mood and Affect: Mood normal.     UC Treatments / Results  Labs (all labs ordered are listed, but only abnormal results are displayed) Labs Reviewed  POCT URINALYSIS DIPSTICK, ED / UC - Abnormal; Notable for the following components:      Result Value   Urobilinogen, UA 2.0 (*)    All other components within normal limits  RPR  HIV ANTIBODY (ROUTINE TESTING W REFLEX)  CYTOLOGY, (ORAL, ANAL, URETHRAL) ANCILLARY ONLY    EKG   Radiology No results found.  Procedures Procedures (including critical care time)  Medications Ordered in UC Medications - No data to display  Initial Impression / Assessment and Plan / UC Course  I have reviewed the triage vital signs and the nursing notes.  Pertinent labs & imaging results that were available during my care of the patient were reviewed by me and considered in my medical decision making (see chart for details).    Assessment negative for red flags or concerns.  Urinalysis positive for urobilinogen but otherwise negative.  Self swab  obtained and will treat based on results.  RPR and HIV pending.  Discussed safe sex practices including condoms or other barrier method use.  Patient instructed to abstain from sexual activity while waiting results of lab test and while undergoing treatment if he is positive.  Follow-up as needed Final Clinical Impressions(s) / UC Diagnoses   Final diagnoses:  Screen for STD (sexually transmitted disease)  Dysuria     Discharge Instructions      We will contact you if the results from your lab work are positive and require additional treatment.    Do not have sex while taking undergoing treatment for STI.  Make sure that all of your partners get tested and treated.   Use a condom or other barrier method for all sexual encounters.    Return or go to the Emergency Department if symptoms worsen or do not improve in the next few days.      ED Prescriptions   None    PDMP not reviewed this encounter.   Ivette Loyal, NP 04/02/21 [phone]

## 2021-04-02 NOTE — ED Triage Notes (Signed)
Pt wanting to get STD tested as partner informed that they were positive chlamydia. Reports some discomfort for week with urination. Denies discharge or drainage.

## 2021-04-02 NOTE — Discharge Instructions (Signed)
We will contact you if the results from your lab work are positive and require additional treatment.    Do not have sex while taking undergoing treatment for STI.  Make sure that all of your partners get tested and treated.   Use a condom or other barrier method for all sexual encounters.    Return or go to the Emergency Department if symptoms worsen or do not improve in the next few days.  

## 2021-04-03 LAB — RPR: RPR Ser Ql: NONREACTIVE

## 2021-04-05 ENCOUNTER — Telehealth (HOSPITAL_COMMUNITY): Payer: Self-pay | Admitting: Emergency Medicine

## 2021-04-05 LAB — CYTOLOGY, (ORAL, ANAL, URETHRAL) ANCILLARY ONLY
Chlamydia: POSITIVE — AB
Comment: NEGATIVE
Comment: NEGATIVE
Comment: NORMAL
Neisseria Gonorrhea: NEGATIVE
Trichomonas: POSITIVE — AB

## 2021-04-05 MED ORDER — METRONIDAZOLE 500 MG PO TABS: 2000.0000 mg | ORAL_TABLET | Freq: Once | ORAL | 0 refills | Status: AC

## 2021-04-05 MED ORDER — DOXYCYCLINE HYCLATE 100 MG PO CAPS: 100.0000 mg | ORAL_CAPSULE | Freq: Two times a day (BID) | ORAL | 0 refills | Status: AC

## 2021-04-10 ENCOUNTER — Ambulatory Visit (HOSPITAL_COMMUNITY)
Admission: EM | Admit: 2021-04-10 | Discharge: 2021-04-10 | Disposition: A | Discharge status: Home / Self Care | Payer: Medicaid Other | Attending: Student | Admitting: Student

## 2021-04-10 ENCOUNTER — Encounter (HOSPITAL_COMMUNITY): Payer: Self-pay

## 2021-04-10 ENCOUNTER — Other Ambulatory Visit: Payer: Self-pay

## 2021-04-10 DIAGNOSIS — J09X2 Influenza due to identified novel influenza A virus with other respiratory manifestations: Secondary | ICD-10-CM

## 2021-04-10 LAB — POC INFLUENZA A AND B ANTIGEN (URGENT CARE ONLY)
INFLUENZA A ANTIGEN, POC: POSITIVE — AB
INFLUENZA B ANTIGEN, POC: NEGATIVE

## 2021-04-10 MED ORDER — TRIAMCINOLONE ACETONIDE 55 MCG/ACT NA AERO: 2.0000 | INHALATION_SPRAY | Freq: Every day | NASAL | 1 refills | Status: DC

## 2021-04-10 MED ORDER — ONDANSETRON 8 MG PO TBDP: 8.0000 mg | ORAL_TABLET | Freq: Three times a day (TID) | ORAL | 0 refills | Status: DC | PRN

## 2021-04-10 MED ORDER — IBUPROFEN 600 MG PO TABS: 600.0000 mg | ORAL_TABLET | Freq: Three times a day (TID) | ORAL | 0 refills | Status: DC | PRN

## 2021-04-10 NOTE — Discharge Instructions (Addendum)
-  You have flu A -Nasacort 1-2x daily -Take the Zofran (ondansetron) up to 3 times daily for nausea and vomiting. -For fevers/chills, bodyaches, headaches- You can take Tylenol up to 1000 mg 3 times daily, and ibuprofen up to 600 mg 3 times daily with food.  You can take these together, or alternate every 3-4 hours. -Drink plenty of water/gatorade and get plenty of rest -With a virus, you're typically contagious for 5-7 days, or as long as you're having fevers.  -Come back and see [place] if things are getting worse instead of better, like shortness of breath, chest pain, fevers and chills that are getting higher instead of lower and do not come down with Tylenol or ibuprofen, etc.

## 2021-04-10 NOTE — ED Triage Notes (Signed)
Pt presents with c/o headaches, heat flashes, neck and back pain x 4 days. States he has a sore throat and has a stuffy nose. States he has taken NyQuil and Ibuprofen. No relief.

## 2021-04-10 NOTE — ED Provider Notes (Signed)
[place]    CSN: [id number] Arrival date & time: 04/10/21  1012      History   Chief Complaint Chief Complaint  Patient presents with   Generalized Body Aches   Chills    HPI Jorge Day is a 20 y.o. male presenting with viral syndrome for 4 days.  Medical history asthma, ADHD, intellectual disability.  Describes 4 days of generalized body aches, decreased appetite, subjective chills, sore throat, nasal congestion, diarrhea., generalized crampy abd pain.  NyQuil and DayQuil providing minimal relief.  Tolerating fluids and food, despite decreased appetite. Denies n/v. Denies known sick contacts.  States asthma is currently well controlled without albuterol inhaler.  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   Influenza vaccination declined 04/12/2016   ADHD (attention deficit hyperactivity disorder), 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   Intellectual disability 04/16/2015   Oppositional defiant disorder 04/15/2015   Family circumstance 12/16/2014   Psychosocial stressors 12/16/2014   Mild persistent asthma 10/29/2013   Seasonal allergies 10/29/2013    Past Surgical History:  Procedure Laterality Date   CLOSED REDUCTION FINGER WITH PERCUTANEOUS PINNING Right 07/23/2016   Procedure: CLOSED REDUCTION PERCUTANEOUS PINNING OF RIGHT SMALL  METACARPAL FRACTURE;  Surgeon: Charlotte Crumb, MD;  Location: [facility];  Service: Orthopedics;  Laterality: Right;   TYMPANOSTOMY TUBE PLACEMENT         Home Medications    Prior to Admission medications   Medication Sig Start Date End Date Taking? Authorizing Provider  ibuprofen (ADVIL) 600 MG tablet Take 1 tablet (600 mg total) by mouth every 8 (eight) hours as needed. 04/10/21  Yes Hazel Sams, PA-C  ondansetron (ZOFRAN ODT) 8 MG disintegrating tablet Take 1 tablet (8 mg total) by mouth every 8 (eight) hours as needed for nausea or vomiting. 04/10/21  Yes Hazel Sams, PA-C  triamcinolone (NASACORT) 55 MCG/ACT AERO nasal inhaler Place 2 sprays into the nose daily. 04/10/21  Yes Hazel Sams, PA-C  albuterol (VENTOLIN HFA) 108 (90 Base) MCG/ACT inhaler Inhale 2 puffs into the lungs every 6 (six) hours as needed for wheezing or shortness of breath.  10/19/15   [provider]  CONCERTA 18 MG CR tablet Take 18 mg by mouth every morning. 12/24/18   [provider]  doxycycline (VIBRAMYCIN) 100 MG capsule Take 1 capsule (100 mg total) by mouth 2 (two) times daily for 7 days. 04/05/21 04/12/21  Chase Picket, MD  hydrOXYzine (ATARAX/VISTARIL) 50 MG tablet Take 1 tablet (50 mg total) by mouth at bedtime. 12/27/18   Ambrose Finland, MD  lidocaine (XYLOCAINE) 2 % solution Use as directed 10 mLs in the mouth or throat as needed for mouth pain. 02/12/21   Volney American, PA-C  mupirocin ointment (BACTROBAN) 2 % Apply 1 application topically 2 (two) times daily. 02/12/21   Volney American, PA-C  Olopatadine HCl 0.2 % SOLN Apply  1 drop to eye daily. 04/13/20   Vanessa Kick, MD  paliperidone (INVEGA) 6 MG 24 hr tablet Take 6 mg by mouth daily. 12/18/19   [provider]  tobramycin (TOBREX) 0.3 % ophthalmic solution Place 1 drop into the right eye every 6 (six) hours. 04/13/20   Vanessa Kick, MD  traZODone (DESYREL) 50 MG tablet Take 50 mg by mouth at  bedtime as needed for sleep. 05/14/19   [provider]  valACYclovir (VALTREX) 1000 MG tablet Take 1 tablet (1,000 mg total) by mouth 3 (three) times daily. 10/04/20   Jaynee Eagles, PA-C  Cetirizine HCl 10 MG CAPS Take 1 capsule (10 mg total) by mouth daily. 02/27/19 04/13/20  Wieters, Hallie C, PA-C  dicyclomine (BENTYL) 20 MG tablet Take 1 tablet (20 mg total) by mouth 4 (four) times daily -  before meals and at bedtime. 02/27/19 07/18/19  Wieters, Hallie C, PA-C  fluticasone (FLONASE) 50 MCG/ACT nasal spray Place 1 spray into both nostrils daily. Patient taking differently: Place 1 spray into both nostrils daily as needed for allergies.  05/12/19 04/13/20  Avegno, Darrelyn Hillock, FNP  OLANZapine (ZYPREXA) 15 MG tablet Take 15 mg by mouth at bedtime. Patient not taking: Reported on 02/07/2020 06/12/19 04/13/20  [provider]  omeprazole (PRILOSEC) 20 MG capsule Take 1 capsule (20 mg total) by mouth daily. 01/29/20 04/13/20  Zigmund Gottron, NP    Family History Family History  Problem Relation Age of Onset   Depression Mother    Hypertension Mother    Asthma Father    Alcohol abuse Father    Mental illness Brother    Mental illness Maternal Aunt    Cancer Maternal Grandmother    Brain cancer Maternal Grandmother     Social History Social History   Tobacco Use   Smoking status: Passive Smoke Exposure - Never Smoker   Smokeless tobacco: Never  Substance Use Topics   Alcohol use: No   Drug use: No     Allergies   Patient has no known allergies.   Review of Systems Review of Systems  Constitutional:  Positive for appetite change and chills. Negative for fever.  HENT:  Positive for congestion and sore throat. Negative for ear pain, rhinorrhea, sinus pressure and sinus pain.   Eyes:  Negative for redness and visual disturbance.  Respiratory:  Positive for cough. Negative for chest tightness, shortness of breath and wheezing.   Cardiovascular:  Negative for chest pain and  palpitations.  Gastrointestinal:  Positive for abdominal pain. Negative for constipation, diarrhea, nausea and vomiting.  Genitourinary:  Negative for dysuria, frequency and urgency.  Musculoskeletal:  Positive for myalgias.  Neurological:  Negative for dizziness, weakness and headaches.  Psychiatric/Behavioral:  Negative for confusion.   All other systems reviewed and are negative.   Physical Exam Triage Vital Signs ED Triage Vitals  Enc Vitals Group     BP 04/10/21 1113 111/70     Pulse Rate 04/10/21 1112 60     Resp 04/10/21 1112 19     Temp 04/10/21 1112 98.3 F (36.8 C)     Temp Source 04/10/21 1112 Oral     SpO2 04/10/21 1112 98 %     Weight --      Height --      Head Circumference --      Peak Flow --      Pain Score 04/10/21 1111 6     Pain Loc --      Pain  Edu? --      Excl. in GC? --    No data found.  Updated Vital Signs BP 111/70 (BP Location: Left Arm)   Pulse 60   Temp 98.3 F (36.8 C) (Oral)   Resp 19   SpO2 98%   Visual Acuity Right Eye Distance:   Left Eye Distance:   Bilateral Distance:    Right Eye Near:   Left Eye Near:    Bilateral Near:     Physical Exam Vitals reviewed.  Constitutional:      General: He is not in acute distress.    Appearance: Normal appearance. He is ill-appearing.  HENT:     Head: Normocephalic and atraumatic.     Right Ear: Tympanic membrane, ear canal and external ear normal. No tenderness. No middle ear effusion. There is no impacted cerumen. Tympanic membrane is not perforated, erythematous, retracted or bulging.     Left Ear: Tympanic membrane, ear canal and external ear normal. No tenderness.  No middle ear effusion. There is no impacted cerumen. Tympanic membrane is not perforated, erythematous, retracted or bulging.     Nose: Nose normal. No congestion.     Mouth/Throat:     Mouth: Mucous membranes are moist.     Pharynx: Uvula midline. Posterior oropharyngeal erythema present. No oropharyngeal exudate.      Comments: Smooth erythema posterior pharynx Eyes:     Extraocular Movements: Extraocular movements intact.     Pupils: Pupils are equal, round, and reactive to light.  Cardiovascular:     Rate and Rhythm: Normal rate and regular rhythm.     Heart sounds: Normal heart sounds.  Pulmonary:     Effort: Pulmonary effort is normal.     Breath sounds: Normal breath sounds. No decreased breath sounds, wheezing, rhonchi or rales.  Abdominal:     Palpations: Abdomen is soft.     Tenderness: There is no abdominal tenderness. There is no guarding or rebound.  Lymphadenopathy:     Cervical: No cervical adenopathy.     Right cervical: No superficial cervical adenopathy.    Left cervical: No superficial cervical adenopathy.  Neurological:     General: No focal deficit present.     Mental Status: He is alert and oriented to person, place, and time.  Psychiatric:        Mood and Affect: Mood normal.        Behavior: Behavior normal.        Thought Content: Thought content normal.        Judgment: Judgment normal.     UC Treatments / Results  Labs (all labs ordered are listed, but only abnormal results are displayed) Labs Reviewed  POC INFLUENZA A AND B ANTIGEN (URGENT CARE ONLY) - Abnormal; Notable for the following components:      Result Value   INFLUENZA A ANTIGEN, POC POSITIVE (*)    All other components within normal limits    EKG   Radiology No results found.  Procedures Procedures (including critical care time)  Medications Ordered in UC Medications - No data to display  Initial Impression / Assessment and Plan / UC Course  I have reviewed the triage vital signs and the nursing notes.  Pertinent labs & imaging results that were available during my care of the patient were reviewed by me and considered in my medical decision making (see chart for details).     This patient is a very pleasant 20 y.o. year old male presenting with influenza  A. Today this pt is afebrile  nontachycardic nontachypneic, oxygenating well on room air, no wheezes rhonchi or rales. Has not taken antipyretic today.  He is out of tamiflu window.  Symptomatic relief with zofran, nasacort, tylenol/ibuprofen.   ED return precautions discussed. Patient verbalizes understanding and agreement.    Final Clinical Impressions(s) / UC Diagnoses   Final diagnoses:  Influenza due to identified novel influenza A virus with other respiratory manifestations     Discharge Instructions      -You have flu A -Nasacort 1-2x daily -Take the Zofran (ondansetron) up to 3 times daily for nausea and vomiting. -For fevers/chills, bodyaches, headaches- You can take Tylenol up to 1000 mg 3 times daily, and ibuprofen up to 600 mg 3 times daily with food.  You can take these together, or alternate every 3-4 hours. -Drink plenty of water/gatorade and get plenty of rest -With a virus, you're typically contagious for 5-7 days, or as long as you're having fevers.  -Come back and see [place] if things are getting worse instead of better, like shortness of breath, chest pain, fevers and chills that are getting higher instead of lower and do not come down with Tylenol or ibuprofen, etc.    ED Prescriptions     Medication Sig Dispense Auth. Provider   ibuprofen (ADVIL) 600 MG tablet Take 1 tablet (600 mg total) by mouth every 8 (eight) hours as needed. 30 tablet Rhys Martini, PA-C   ondansetron (ZOFRAN ODT) 8 MG disintegrating tablet Take 1 tablet (8 mg total) by mouth every 8 (eight) hours as needed for nausea or vomiting. 20 tablet Rhys Martini, PA-C   triamcinolone (NASACORT) 55 MCG/ACT AERO nasal inhaler Place 2 sprays into the nose daily. 1 each Rhys Martini, PA-C      PDMP not reviewed this encounter.   Rhys Martini, PA-C 04/10/21 1155

## 2021-05-25 ENCOUNTER — Ambulatory Visit (HOSPITAL_COMMUNITY): Payer: Medicaid Other

## 2021-07-29 ENCOUNTER — Ambulatory Visit (HOSPITAL_COMMUNITY)
Admission: RE | Admit: 2021-07-29 | Discharge: 2021-07-29 | Disposition: A | Discharge status: Home / Self Care | Payer: Medicaid Other | Source: Ambulatory Visit | Attending: Family Medicine | Admitting: Family Medicine

## 2021-07-29 ENCOUNTER — Other Ambulatory Visit: Payer: Self-pay

## 2021-07-29 ENCOUNTER — Encounter (HOSPITAL_COMMUNITY): Payer: Self-pay

## 2021-07-29 VITALS — BP 117/54 | HR 69 | Temp 98.4°F | Resp 18

## 2021-07-29 DIAGNOSIS — J029 Acute pharyngitis, unspecified: Secondary | ICD-10-CM | POA: Diagnosis present

## 2021-07-29 DIAGNOSIS — H6992 Unspecified Eustachian tube disorder, left ear: Secondary | ICD-10-CM

## 2021-07-29 DIAGNOSIS — Z113 Encounter for screening for infections with a predominantly sexual mode of transmission: Secondary | ICD-10-CM

## 2021-07-29 DIAGNOSIS — H6982 Other specified disorders of Eustachian tube, left ear: Secondary | ICD-10-CM | POA: Insufficient documentation

## 2021-07-29 DIAGNOSIS — A6000 Herpesviral infection of urogenital system, unspecified: Secondary | ICD-10-CM

## 2021-07-29 HISTORY — DX: Attention-deficit hyperactivity disorder, unspecified type: F90.9

## 2021-07-29 LAB — HIV ANTIBODY (ROUTINE TESTING W REFLEX): HIV Screen 4th Generation wRfx: NONREACTIVE

## 2021-07-29 LAB — POCT INFECTIOUS MONO SCREEN, ED / UC: Mono Screen: NEGATIVE

## 2021-07-29 LAB — POCT RAPID STREP A, ED / UC: Streptococcus, Group A Screen (Direct): NEGATIVE

## 2021-07-29 MED ORDER — VALACYCLOVIR HCL 500 MG PO TABS: 500.0000 mg | ORAL_TABLET | Freq: Two times a day (BID) | ORAL | 0 refills | Status: AC

## 2021-07-29 NOTE — ED Triage Notes (Signed)
Patient complains of sore throat x 1 week.   Patient requesting medications.  ? Herpes.    Patient wants std testing, denies penile discharge

## 2021-07-29 NOTE — Discharge Instructions (Addendum)
You were seen today for several reasons.  We have completed your STD screening.  These will be resulted tomorrow and you will be notified if abnormal or if any treatment is needed.  I have sent out valtrex for herpes breakout today.  Your rapid strep was negative.  We will send this for culture and if antibiotics are needed you will be notified.  You were also tested for mono today.  This was negative.  I recommend tylenol/motrin/salt water gargles for your sore throat in the mean time.  I also recommend over the counter zyrtec or claritin for your sinus issues as this will also help with your ear pain.

## 2021-07-29 NOTE — ED Provider Notes (Signed)
[facility]    CSN: [id number] Arrival date & time: 07/29/21  1349      History   Chief Complaint Chief Complaint  Patient presents with   Appointment    2:00 pm   Sore Throat   SEXUALLY TRANSMITTED DISEASE    HPI Jorge Day is a 21 y.o. male.   Patient is here for several issues.  Sore throat x 1 week, hard to swallow, hard to eat/drink anything.  No fevers.  + sinus congestion.  Also with cough.  No known strep exposures.  His girlfriend has similar issues, being seen at an UC today.   Also here for STD screening.  No penile d/c or painful urination.  No known exposures recently.  He would also like blood work as well.  He does have h/o genital herpes, and has another outbreak currently.  Started several days ago.  He has used oral medication for this in the past.   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   Influenza vaccination declined 04/12/2016   ADHD (attention deficit hyperactivity disorder), 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   Intellectual disability 04/16/2015   Oppositional defiant disorder 04/15/2015   Family circumstance 12/16/2014   Psychosocial stressors 12/16/2014   Mild persistent asthma 10/29/2013   Seasonal allergies 10/29/2013    Past Surgical History:  Procedure Laterality Date   CLOSED REDUCTION FINGER  WITH PERCUTANEOUS PINNING Right 07/23/2016   Procedure: CLOSED REDUCTION PERCUTANEOUS PINNING OF RIGHT SMALL METACARPAL FRACTURE;  Surgeon: Charlotte Crumb, MD;  Location: [facility];  Service: Orthopedics;  Laterality: Right;   TYMPANOSTOMY TUBE PLACEMENT         Home Medications    Prior to Admission medications   Medication Sig Start Date End Date Taking? Authorizing Provider  albuterol (VENTOLIN HFA) 108 (90 Base) MCG/ACT inhaler Inhale 2 puffs into the lungs every 6 (six) hours as needed for wheezing or shortness of breath.  10/19/15   [provider]  CONCERTA 18 MG CR tablet Take 18 mg by mouth every morning. 12/24/18   [provider]  hydrOXYzine (ATARAX/VISTARIL) 50 MG tablet Take 1 tablet (50 mg total) by mouth at bedtime. 12/27/18   Ambrose Finland, MD  ibuprofen (ADVIL) 600 MG tablet Take 1 tablet (600 mg total) by mouth every 8 (eight) hours as needed. 04/10/21   Hazel Sams, PA-C  traZODone (DESYREL) 50 MG tablet Take 50 mg by mouth at bedtime as needed for sleep. 05/14/19   [provider]  Cetirizine HCl 10 MG CAPS Take 1 capsule (10 mg total) by mouth daily. 02/27/19 04/13/20  Wieters, Hallie C, PA-C  dicyclomine (BENTYL) 20 MG tablet Take 1 tablet (20 mg total) by mouth 4 (four) times daily -  before meals and at bedtime. 02/27/19 07/18/19  Wieters, Hallie C, PA-C  fluticasone (FLONASE) 50 MCG/ACT nasal spray Place 1 spray into  both nostrils daily. Patient taking differently: Place 1 spray into both nostrils daily as needed for allergies.  05/12/19 04/13/20  Avegno, Zachery Dakins, FNP  OLANZapine (ZYPREXA) 15 MG tablet Take 15 mg by mouth at bedtime. Patient not taking: Reported on 02/07/2020 06/12/19 04/13/20  [provider]  omeprazole (PRILOSEC) 20 MG capsule Take 1 capsule (20 mg total) by mouth daily. 01/29/20 04/13/20  Georgetta Haber, NP    Family History Family History  Problem Relation Age of Onset   Depression Mother    Hypertension  Mother    Asthma Father    Alcohol abuse Father    Mental illness Brother    Mental illness Maternal Aunt    Cancer Maternal Grandmother    Brain cancer Maternal Grandmother     Social History Social History   Tobacco Use   Smoking status: Never    Passive exposure: Yes   Smokeless tobacco: Never  Vaping Use   Vaping Use: Some days  Substance Use Topics   Alcohol use: No   Drug use: No     Allergies   Patient has no known allergies.   Review of Systems Review of Systems  Constitutional: Negative.   HENT:  Positive for ear pain, rhinorrhea and sore throat.   Eyes: Negative.   Respiratory: Negative.    Cardiovascular: Negative.   Gastrointestinal: Negative.   Genitourinary: Negative.   Skin:  Positive for rash.    Physical Exam Triage Vital Signs ED Triage Vitals  Enc Vitals Group     BP 07/29/21 1422 (!) 117/54     Pulse Rate 07/29/21 1422 69     Resp 07/29/21 1422 18     Temp 07/29/21 1422 98.4 F (36.9 C)     Temp Source 07/29/21 1422 Oral     SpO2 07/29/21 1422 99 %     Weight --      Height --      Head Circumference --      Peak Flow --      Pain Score 07/29/21 1418 6     Pain Loc --      Pain Edu? --      Excl. in [place]? --    No data found.  Updated Vital Signs BP (!) 117/54 (BP Location: Left Arm)    Pulse 69    Temp 98.4 F (36.9 C) (Oral)    Resp 18    SpO2 99%   Visual Acuity Right Eye Distance:   Left Eye Distance:   Bilateral Distance:    Right Eye Near:   Left Eye Near:    Bilateral Near:     Physical Exam Constitutional:      Appearance: He is well-developed.  HENT:     Head: Normocephalic and atraumatic.     Left Ear: A middle ear effusion is present.     Nose: Congestion present.     Mouth/Throat:     Pharynx: Pharyngeal swelling and posterior oropharyngeal erythema present. No oropharyngeal exudate.     Tonsils: No tonsillar exudate or tonsillar abscesses. 2+ on the right. 2+ on the left.  Cardiovascular:     Rate and  Rhythm: Normal rate and regular rhythm.  Pulmonary:     Effort: Pulmonary effort is normal.     Breath sounds: Normal breath sounds.  Musculoskeletal:     Cervical back: Normal range of motion and neck supple.  Lymphadenopathy:     Cervical: Cervical adenopathy present.  Neurological:  Mental Status: He is alert.     UC Treatments / Results  Labs (all labs ordered are listed, but only abnormal results are displayed) Labs Reviewed  CULTURE, GROUP A STREP ([facility])  RPR  HIV ANTIBODY (ROUTINE TESTING W REFLEX)  POCT RAPID STREP A, ED / UC  POCT INFECTIOUS MONO SCREEN, ED / UC  CYTOLOGY, (ORAL, ANAL, URETHRAL) ANCILLARY ONLY    EKG   Radiology No results found.  Procedures Procedures (including critical care time)  Medications Ordered in UC Medications - No data to display  Initial Impression / Assessment and Plan / UC Course  I have reviewed the triage vital signs and the nursing notes.  Pertinent labs & imaging results that were available during my care of the patient were reviewed by me and considered in my medical decision making (see chart for details).   Final Clinical Impressions(s) / UC Diagnoses   Final diagnoses:  Sore throat  Screening examination for STD (sexually transmitted disease)  Genital herpes simplex, unspecified site  Dysfunction of left eustachian tube     Discharge Instructions      You were seen today for several reasons.  We have completed your STD screening.  These will be resulted tomorrow and you will be notified if abnormal or if any treatment is needed.  I have sent out valtrex for herpes breakout today.  Your rapid strep was negative.  We will send this for culture and if antibiotics are needed you will be notified.  You were also tested for mono today.  This was negative.  I recommend tylenol/motrin/salt water gargles for your sore throat in the mean time.  I also recommend over the counter zyrtec or claritin for your sinus  issues as this will also help with your ear pain.     ED Prescriptions     Medication Sig Dispense Auth. Provider   valACYclovir (VALTREX) 500 MG tablet Take 1 tablet (500 mg total) by mouth 2 (two) times daily for 5 days. 10 tablet Rondel Oh, MD      PDMP not reviewed this encounter.   Rondel Oh, MD 07/29/21 [phone]

## 2021-07-30 ENCOUNTER — Telehealth (HOSPITAL_COMMUNITY): Payer: Self-pay | Admitting: Emergency Medicine

## 2021-07-30 LAB — CYTOLOGY, (ORAL, ANAL, URETHRAL) ANCILLARY ONLY
Chlamydia: NEGATIVE
Comment: NEGATIVE
Comment: NEGATIVE
Comment: NORMAL
Neisseria Gonorrhea: NEGATIVE
Trichomonas: NEGATIVE

## 2021-07-30 LAB — CULTURE, GROUP A STREP (THRC)

## 2021-07-30 LAB — RPR: RPR Ser Ql: NONREACTIVE

## 2021-07-30 MED ORDER — AMOXICILLIN 500 MG PO CAPS: 500.0000 mg | ORAL_CAPSULE | Freq: Two times a day (BID) | ORAL | 0 refills | Status: AC

## 2021-09-21 ENCOUNTER — Encounter (HOSPITAL_COMMUNITY): Payer: Self-pay | Admitting: Emergency Medicine

## 2021-09-21 ENCOUNTER — Ambulatory Visit (INDEPENDENT_AMBULATORY_CARE_PROVIDER_SITE_OTHER): Payer: Medicaid Other

## 2021-09-21 ENCOUNTER — Other Ambulatory Visit: Payer: Self-pay

## 2021-09-21 ENCOUNTER — Ambulatory Visit (HOSPITAL_COMMUNITY)
Admission: EM | Admit: 2021-09-21 | Discharge: 2021-09-21 | Disposition: A | Discharge status: Home / Self Care | Payer: Medicaid Other | Attending: Nurse Practitioner | Admitting: Nurse Practitioner

## 2021-09-21 DIAGNOSIS — M79671 Pain in right foot: Secondary | ICD-10-CM | POA: Diagnosis not present

## 2021-09-21 NOTE — Discharge Instructions (Addendum)
-   Your foot x-ray today does not show an acute fracture ?-We have put a Ace wrap around her foot and have given you crutches-you can stay off of it as long as it is causing you pain ?-You can continue alternating Tylenol with ibuprofen to help with pain; please keep your foot elevated and continue to use ice to help with pain and swelling ?-If the pain persists more than a few days without improvement, please follow-up with the foot and ankle center-contact information is below ?

## 2021-09-21 NOTE — ED Provider Notes (Signed)
?[facility] ? ? ? ?CSN: [id number] ?Arrival date & time: 09/21/21  1952 ? ? ?  ? ?History   ?Chief Complaint ?Chief Complaint  ?Patient presents with  ? Fall  ? ? ?HPI ?Cayce Hull is a 21 y.o. male.  ? ?Patient reports severe right foot pain after a fall while playing basketball about 1 hour ago.  He does not recall exactly what happened, however endorses significant foot pain in the top of the foot that radiates up to the ankle and up towards the calf.  He denies any numbness or tingling in his toes.  It has been difficult for him to bear weight since the injury occurred.  He has taken ibuprofen and tried ice without any relief of symptoms. ? ? ?Past Medical History:  ?Diagnosis Date  ? ADHD   ? ADHD (attention deficit hyperactivity disorder)   ? Anxiety   ? 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 Asthma   ? Headache   ? History of ADHD 04/16/2015  ? Intellectual disability 04/16/2015  ? 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 ? ?Patient Active Problem List  ? Diagnosis Date Noted  ? 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 Influenza vaccination declined 04/12/2016  ? ADHD (attention deficit hyperactivity disorder), 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  ? 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 Intellectual disability 04/16/2015  ? Oppositional defiant disorder 04/15/2015  ? Family circumstance 12/16/2014  ? Psychosocial stressors 12/16/2014  ? Mild persistent asthma 10/29/2013  ? Seasonal allergies 10/29/2013  ? ? ?Past Surgical History:  ?Procedure Laterality Date  ? CLOSED REDUCTION FINGER WITH PERCUTANEOUS PINNING Right 07/23/2016  ? Procedure: CLOSED REDUCTION PERCUTANEOUS PINNING OF RIGHT SMALL METACARPAL FRACTURE;  Surgeon: Charlotte Crumb, MD;  Location: [facility];  Service: Orthopedics;  Laterality: Right;  ? TYMPANOSTOMY TUBE PLACEMENT    ? ? ? ? ? ?Home Medications   ? ?Prior to Admission medications   ?Medication Sig Start Date End Date Taking? Authorizing Provider  ?albuterol (VENTOLIN HFA) 108 (90 Base) MCG/ACT inhaler Inhale 2 puffs into the lungs every 6 (six) hours as needed for wheezing or shortness of breath.  10/19/15   [provider]  ?CONCERTA 18 MG CR tablet Take 18 mg by mouth every morning. 12/24/18   [provider]  ?hydrOXYzine (ATARAX/VISTARIL) 50 MG tablet Take 1 tablet (50 mg total) by mouth at bedtime. 12/27/18   Ambrose Finland, MD  ?ibuprofen (ADVIL) 600 MG tablet Take 1 tablet (600 mg total) by mouth every 8 (eight) hours as needed. 04/10/21   Hazel Sams, PA-C  ?traZODone (DESYREL) 50 MG tablet Take 50 mg by mouth at bedtime as needed for sleep. 05/14/19   [provider]  ?Cetirizine HCl 10 MG CAPS Take 1 capsule (10 mg total) by mouth daily. 02/27/19 04/13/20  Wieters, Hallie C, PA-C  ?dicyclomine (BENTYL) 20 MG tablet Take 1 tablet (20 mg total) by mouth 4 (four) times daily -  before meals and at bedtime. 02/27/19 07/18/19  Wieters, Hallie C, PA-C  ?fluticasone (FLONASE) 50 MCG/ACT nasal spray Place 1 spray into both nostrils daily. ?Patient taking differently: Place 1 spray into both nostrils daily as needed for allergies.  05/12/19 04/13/20  Emerson Monte, FNP  ?OLANZapine (ZYPREXA) 15 MG tablet Take 15 mg  by mouth at bedtime. ?Patient not taking: Reported on 02/07/2020 06/12/19 04/13/20  [provider]  ?omeprazole (PRILOSEC) 20 MG capsule Take 1 capsule (20 mg total) by mouth daily. 01/29/20 04/13/20  Zigmund Gottron, NP  ? ? ?Family History ?Family History  ?Problem Relation Age of Onset  ? Depression Mother   ? Hypertension Mother   ? Asthma Father   ? Alcohol abuse Father   ? Mental illness Brother   ? Mental illness Maternal Aunt   ? Cancer Maternal Grandmother   ?  Brain cancer Maternal Grandmother   ? ? ?Social History ?Social History  ? ?Tobacco Use  ? Smoking status: Never  ?  Passive exposure: Yes  ? Smokeless tobacco: Never  ?Vaping Use  ? Vaping Use: Some days  ?Substance Use Topics  ? Alcohol use: Yes  ? Drug use: No  ? ? ? ?Allergies   ?Patient has no known allergies. ? ? ?Review of Systems ?Review of Systems ?Per HPI ? ?Physical Exam ?Triage Vital Signs ?ED Triage Vitals  ?Enc Vitals Group  ?   BP 09/21/21 2019 119/62  ?   Pulse Rate 09/21/21 2019 63  ?   Resp 09/21/21 2019 18  ?   Temp 09/21/21 2019 99.3 ?F (37.4 ?C)  ?   Temp Source 09/21/21 2019 Oral  ?   SpO2 09/21/21 2019 97 %  ?   Weight --   ?   Height --   ?   Head Circumference --   ?   Peak Flow --   ?   Pain Score 09/21/21 2016 10  ?   Pain Loc --   ?   Pain Edu? --   ?   Excl. in [place]? --   ? ?No data found. ? ?Updated Vital Signs ?BP 119/62 (BP Location: Right Arm)   Pulse 63   Temp 99.3 ?F (37.4 ?C) (Oral)   Resp 18   SpO2 97%  ? ?Visual Acuity ?Right Eye Distance:   ?Left Eye Distance:   ?Bilateral Distance:   ? ?Right Eye Near:   ?Left Eye Near:    ?Bilateral Near:    ? ?Physical Exam ?Vitals and nursing note reviewed.  ?Constitutional:   ?   General: He is not in acute distress. ?   Appearance: Normal appearance. He is not toxic-appearing.  ?Pulmonary:  ?   Effort: Pulmonary effort is normal. No respiratory distress.  ?Musculoskeletal:  ?   Right ankle: No swelling. Normal pulse.  ?   Right foot: Normal range of motion and normal capillary refill. Swelling, tenderness and bony tenderness present. No prominent metatarsal heads. Normal pulse.  ?   Left foot: Normal.  ?   Comments: Patient is able to move all 5 toes on right foot.  Mild swelling to dorsal aspect of right foot, tender to light palpation.  No obvious deformity, erythema observed.  He is unable to move ankle secondary to pain.  ?Skin: ?   General: Skin is warm and dry.  ?   Capillary Refill: Capillary refill takes less than 2 seconds.  ?    Coloration: Skin is not jaundiced or pale.  ?   Findings: No erythema.  ?Neurological:  ?   Mental Status: He is alert and oriented to person, place, and time.  ?   Gait: Gait abnormal (Patient sitting in wheelchair).  ?Psychiatric:     ?   Mood and Affect: Mood normal.     ?  Behavior: Behavior normal.  ? ? ? ?UC Treatments / Results  ?Labs ?(all labs ordered are listed, but only abnormal results are displayed) ?Labs Reviewed - No data to display ? ?EKG ? ? ?Radiology ?DG Foot Complete Right ? ?Result Date: 09/21/2021 ?CLINICAL DATA:  Right foot injury, pain EXAM: RIGHT FOOT COMPLETE - 3+ VIEW COMPARISON:  04/30/2013 FINDINGS: Frontal, oblique, and lateral views of the right foot are obtained. No acute fracture, subluxation, or dislocation. Joint spaces are well preserved. Soft tissue prominence plantar aspect right forefoot seen on lateral view. IMPRESSION: 1. Plantar soft tissue swelling of the forefoot. 2. No acute bony abnormality. Electronically Signed   By: Randa Ngo M.D.   On: 09/21/2021 20:31   ? ?Procedures ?Procedures (including critical care time) ? ?Medications Ordered in UC ?Medications - No data to display ? ?Initial Impression / Assessment and Plan / UC Course  ?I have reviewed the triage vital signs and the nursing notes. ? ?Pertinent labs & imaging results that were available during my care of the patient were reviewed by me and considered in my medical decision making (see chart for details). ? ?  ?Foot x-ray today does not show any acute fracture.  Will apply Ace wrap and give crutches today so patient can stay off of foot for a couple of days.  Will provide note for work.  Continue alternating Tylenol and ibuprofen for pain relief.  Can ice and elevate to help with swelling and pain as well.  Seek care with foot and ankle center if symptoms do not improve over the next week or so or if they worsen. ?Final Clinical Impressions(s) / UC Diagnoses  ? ?Final diagnoses:  ?Right foot pain   ? ? ? ?Discharge Instructions   ? ?  ?- Your foot x-ray today does not show an acute fracture ?-We have put a Ace wrap around her foot and have given you crutches-you can stay off of it as long as it is causing you pain

## 2021-09-21 NOTE — ED Triage Notes (Addendum)
Patient is nonspecific about what happened.  Not sure if he knows for certain.  Touches calf of leg as a painful area.  States he cannot move ankle.   ?

## 2021-10-27 ENCOUNTER — Ambulatory Visit (HOSPITAL_COMMUNITY)
Admission: RE | Admit: 2021-10-27 | Discharge: 2021-10-27 | Disposition: A | Discharge status: Home / Self Care | Payer: Medicaid Other | Source: Ambulatory Visit | Attending: Internal Medicine | Admitting: Internal Medicine

## 2021-10-27 ENCOUNTER — Encounter (HOSPITAL_COMMUNITY): Payer: Self-pay

## 2021-10-27 VITALS — BP 155/52 | HR 55 | Temp 98.4°F | Resp 14

## 2021-10-27 DIAGNOSIS — J039 Acute tonsillitis, unspecified: Secondary | ICD-10-CM | POA: Insufficient documentation

## 2021-10-27 DIAGNOSIS — J029 Acute pharyngitis, unspecified: Secondary | ICD-10-CM | POA: Insufficient documentation

## 2021-10-27 LAB — POCT RAPID STREP A, ED / UC: Streptococcus, Group A Screen (Direct): NEGATIVE

## 2021-10-27 LAB — POCT INFECTIOUS MONO SCREEN, ED / UC: Mono Screen: NEGATIVE

## 2021-10-27 MED ORDER — AMOXICILLIN-POT CLAVULANATE 875-125 MG PO TABS: 1.0000 | ORAL_TABLET | Freq: Two times a day (BID) | ORAL | 0 refills | Status: DC

## 2021-10-27 NOTE — ED Provider Notes (Signed)
[facility]    CSN: [id number] Arrival date & time: 10/27/21  1654      History   Chief Complaint Chief Complaint  Patient presents with   Sore Throat    I can't swallow it hurts very bad - Entered by patient   appt 5p    HPI Jorge Day is a 21 y.o. male.   Patient presents today with a several day history of severe sore throat.  He reports that pain is rated 2 at rest but increases to 8 with attempted swallowing, localized to posterior oropharynx without radiation, described as sharp, no aggravating relieving factors identified.  He does report some mild nasal congestion but denies significant other symptoms including cough, fever, nausea, vomiting, chest pain, shortness of breath.  He has tried DayQuil and NyQuil without improvement.  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  Denies any recent antibiotics in the past 90 days.  He has had COVID in the past but states current symptoms are not similar to previous episodes of this condition.   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   Influenza vaccination declined 04/12/2016   ADHD (attention deficit hyperactivity disorder), 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   Intellectual disability 04/16/2015    Oppositional defiant disorder 04/15/2015   Family circumstance 12/16/2014   Psychosocial stressors 12/16/2014   Mild persistent asthma 10/29/2013   Seasonal allergies 10/29/2013    Past Surgical History:  Procedure Laterality Date   CLOSED REDUCTION FINGER WITH PERCUTANEOUS PINNING Right 07/23/2016   Procedure: CLOSED REDUCTION PERCUTANEOUS PINNING OF RIGHT SMALL METACARPAL FRACTURE;  Surgeon: Dairl Ponder, MD;  [facility];  Service: Orthopedics;  Laterality: Right;   TYMPANOSTOMY TUBE PLACEMENT         Home Medications    Prior to Admission medications   Medication Sig Start Date End Date Taking? Authorizing Provider  amoxicillin-clavulanate (AUGMENTIN) 875-125 MG tablet Take 1 tablet by mouth every 12 (twelve) hours. 10/27/21  Yes Winter Jocelyn K, PA-C  albuterol (VENTOLIN HFA) 108 (90 Base) MCG/ACT inhaler Inhale 2 puffs into the lungs every 6 (six) hours as needed for wheezing or shortness of breath.  10/19/15   [provider]  CONCERTA 18 MG CR tablet Take 18 mg by mouth every morning. 12/24/18   [provider]  hydrOXYzine (ATARAX/VISTARIL) 50 MG tablet Take 1 tablet (50 mg total) by mouth at bedtime. 12/27/18   Leata Mouse, MD  ibuprofen (ADVIL) 600 MG tablet Take 1 tablet (600 mg total) by mouth every 8 (eight) hours as needed. 04/10/21   Rhys Martini, PA-C  traZODone (DESYREL) 50 MG tablet Take 50 mg by mouth at bedtime as needed for sleep. 05/14/19   [provider]  Cetirizine HCl 10 MG CAPS Take 1 capsule (10 mg total) by  mouth daily. 02/27/19 04/13/20  Wieters, Hallie C, PA-C  dicyclomine (BENTYL) 20 MG tablet Take 1 tablet (20 mg total) by mouth 4 (four) times daily -  before meals and at bedtime. 02/27/19 07/18/19  Wieters, Hallie C, PA-C  fluticasone (FLONASE) 50 MCG/ACT nasal spray Place 1 spray into both nostrils daily. Patient taking differently: Place 1 spray into both nostrils daily as needed for allergies.  05/12/19 04/13/20   Avegno, Zachery DakinsKomlanvi S, FNP  OLANZapine (ZYPREXA) 15 MG tablet Take 15 mg by mouth at bedtime. Patient not taking: Reported on 02/07/2020 06/12/19 04/13/20  [provider]  omeprazole (PRILOSEC) 20 MG capsule Take 1 capsule (20 mg total) by mouth daily. 01/29/20 04/13/20  Georgetta HaberBurky, Natalie B, NP    Family History Family History  Problem Relation Age of Onset   Depression Mother    Hypertension Mother    Asthma Father    Alcohol abuse Father    Mental illness Brother    Mental illness Maternal Aunt    Cancer Maternal Grandmother    Brain cancer Maternal Grandmother     Social History Social History   Tobacco Use   Smoking status: Never    Passive exposure: Yes   Smokeless tobacco: Never  Vaping Use   Vaping Use: Some days  Substance Use Topics   Alcohol use: Yes   Drug use: No     Allergies   Patient has no known allergies.   Review of Systems Review of Systems  Constitutional:  Positive for activity change. Negative for appetite change, fatigue and fever.  HENT:  Positive for congestion and sore throat. Negative for sinus pressure and sneezing.   Respiratory:  Negative for cough and shortness of breath.   Cardiovascular:  Negative for chest pain.  Gastrointestinal:  Negative for abdominal pain, diarrhea, nausea and vomiting.  Neurological:  Negative for dizziness, light-headedness and headaches.    Physical Exam Triage Vital Signs ED Triage Vitals [10/27/21 1826]  Enc Vitals Group     BP (!) 155/52     Pulse Rate (!) 55     Resp 14     Temp 98.4 F (36.9 C)     Temp Source Oral     SpO2 100 %     Weight      Height      Head Circumference      Peak Flow      Pain Score 2     Pain Loc      Pain Edu?      Excl. in GC?    No data found.  Updated Vital Signs BP (!) 155/52 (BP Location: Left Arm)   Pulse (!) 55   Temp 98.4 F (36.9 C) (Oral)   Resp 14   SpO2 100%   Visual Acuity Right Eye Distance:   Left Eye Distance:   Bilateral Distance:     Right Eye Near:   Left Eye Near:    Bilateral Near:     Physical Exam Vitals reviewed.  Constitutional:      General: He is awake.     Appearance: Normal appearance. He is well-developed. He is not ill-appearing.     Comments: Very pleasant male appears stated age in no acute distress sitting comfortably in exam room  HENT:     Head: Normocephalic and atraumatic.     Right Ear: Ear canal and external ear normal. There is impacted cerumen. Tympanic membrane is not erythematous or bulging.     Left  Ear: Tympanic membrane, ear canal and external ear normal. Tympanic membrane is not erythematous or bulging.     Nose: Nose normal.     Mouth/Throat:     Pharynx: Uvula midline. Posterior oropharyngeal erythema present. No oropharyngeal exudate or uvula swelling.     Tonsils: Tonsillar exudate present. No tonsillar abscesses. 2+ on the right. 2+ on the left.  Cardiovascular:     Rate and Rhythm: Normal rate and regular rhythm.     Heart sounds: Normal heart sounds, S1 normal and S2 normal. No murmur heard. Pulmonary:     Effort: Pulmonary effort is normal. No accessory muscle usage or respiratory distress.     Breath sounds: Normal breath sounds. No stridor. No wheezing, rhonchi or rales.     Comments: Clear to auscultation bilaterally Abdominal:     General: Bowel sounds are normal.     Palpations: Abdomen is soft.     Tenderness: There is no abdominal tenderness.  Lymphadenopathy:     Head:     Right side of head: No submental, submandibular or tonsillar adenopathy.     Left side of head: No submental, submandibular or tonsillar adenopathy.     Cervical: No cervical adenopathy.  Neurological:     Mental Status: He is alert.  Psychiatric:        Behavior: Behavior is cooperative.     UC Treatments / Results  Labs (all labs ordered are listed, but only abnormal results are displayed) Labs Reviewed  CULTURE, GROUP A STREP [facility])  POCT RAPID STREP A, ED / UC  POCT INFECTIOUS  MONO SCREEN, ED / UC    EKG   Radiology No results found.  Procedures Procedures (including critical care time)  Medications Ordered in UC Medications - No data to display  Initial Impression / Assessment and Plan / UC Course  I have reviewed the triage vital signs and the nursing notes.  Pertinent labs & imaging results that were available during my care of the patient were reviewed by me and considered in my medical decision making (see chart for details).     Strep was negative in clinic today.  Mono was also negative.  Given symptoms will cover with Augmentin for tonsillitis.  Discussed that if mono is falsely negative in clinic today he could develop a rash and if that happens he is to stop the medication to be seen immediately.  Recommended conservative treatment measures including gargling with warm salt water and alternate Tylenol and ibuprofen.  If he develops any additional symptoms including fever, nausea/vomiting, dysphagia, odynophagia, muffled voice, swelling of his throat he needs to be seen immediately.  Strict return precautions given.  Work excuse note provided.  Final Clinical Impressions(s) / UC Diagnoses   Final diagnoses:  Acute tonsillitis, unspecified etiology  Sore throat     Discharge Instructions      Your strep and mono are negative in clinic today.  Your throat does appear to have an infection.  Please start Augmentin twice daily for 7 days.  Gargle with warm salt water and use Tylenol ibuprofen for pain.  If your symptoms do not improving within a few days of being on the antibiotic you should return for reevaluation.  If you develop any rash please stop the medication and be seen.  If you have worsening symptoms including high fever, difficulty swallowing, pain with swallowing, swelling of your throat, muffled voice you need to be seen immediately.     ED Prescriptions  Medication Sig Dispense Auth. Provider   amoxicillin-clavulanate  (AUGMENTIN) 875-125 MG tablet Take 1 tablet by mouth every 12 (twelve) hours. 14 tablet Muaz Shorey, Noberto Retort, PA-C      PDMP not reviewed this encounter.   Jeani Hawking, PA-C 10/27/21 1915

## 2021-10-27 NOTE — ED Triage Notes (Signed)
Pt had sore throat for a couple days. Reports taking dayquil and other OTC medications without relief.

## 2021-10-27 NOTE — Discharge Instructions (Signed)
Your strep and mono are negative in clinic today.  Your throat does appear to have an infection.  Please start Augmentin twice daily for 7 days.  Gargle with warm salt water and use Tylenol ibuprofen for pain.  If your symptoms do not improving within a few days of being on the antibiotic you should return for reevaluation.  If you develop any rash please stop the medication and be seen.  If you have worsening symptoms including high fever, difficulty swallowing, pain with swallowing, swelling of your throat, muffled voice you need to be seen immediately.

## 2021-10-28 LAB — CULTURE, GROUP A STREP (THRC)

## 2022-06-20 ENCOUNTER — Ambulatory Visit (HOSPITAL_COMMUNITY)
Admission: EM | Admit: 2022-06-20 | Discharge: 2022-06-20 | Disposition: A | Discharge status: Home / Self Care | Payer: Medicaid Other | Attending: Family Medicine | Admitting: Family Medicine

## 2022-06-20 ENCOUNTER — Ambulatory Visit (INDEPENDENT_AMBULATORY_CARE_PROVIDER_SITE_OTHER): Payer: Medicaid Other

## 2022-06-20 ENCOUNTER — Encounter (HOSPITAL_COMMUNITY): Payer: Self-pay | Admitting: Emergency Medicine

## 2022-06-20 DIAGNOSIS — S62346A Nondisplaced fracture of base of fifth metacarpal bone, right hand, initial encounter for closed fracture: Secondary | ICD-10-CM

## 2022-06-20 DIAGNOSIS — M79631 Pain in right forearm: Secondary | ICD-10-CM

## 2022-06-20 DIAGNOSIS — M79601 Pain in right arm: Secondary | ICD-10-CM | POA: Diagnosis not present

## 2022-06-20 DIAGNOSIS — M79641 Pain in right hand: Secondary | ICD-10-CM

## 2022-06-20 MED ORDER — IBUPROFEN 800 MG PO TABS: 800.0000 mg | ORAL_TABLET | Freq: Three times a day (TID) | ORAL | 0 refills | Status: AC

## 2022-06-20 NOTE — Discharge Instructions (Addendum)
Please rest, ice and elevate the affected extremity. Follow up with orthopaedic surgery within one week for further evaluation. Please call for any appointment. Do not remove your splint. You may use a garbage bag while showering to keep your splint dry. Please return here if you are experiencing increased pain, tingling/numbness, swelling, redness, or fever. ° °

## 2022-06-20 NOTE — ED Triage Notes (Signed)
Was out last night, does not remember what happened, woke up to right arm pain and swelling. Pain elicited on palpation of right forearm, just distal to elbow. Reports pain in generalized right wrist and hand. Visible swelling to posterior right hand over 4th and 5th metacarpals. Hesitant to move fingers due to pain. All digits are warm, normal in color and temperature. Reports a tingling sensation to entire arm distal to elbow.

## 2022-06-20 NOTE — Progress Notes (Signed)
Orthopedic Tech Progress Note Patient Details:  Jorge Day [birth year] [id number]  Ortho Devices Type of Ortho Device: Ulna gutter splint Ortho Device/Splint Location: Right hand Ortho Device/Splint Interventions: Application   Post Interventions Patient Tolerated: Well Instructions Provided: Care of device  Jorge Day E Jorge Day 06/20/2022, 11:23 AM

## 2022-06-20 NOTE — ED Provider Notes (Signed)
[place]   [id number] 06/20/22 Arrival Time: 0848  ASSESSMENT & PLAN:  1. Closed nondisplaced fracture of base of fifth metacarpal bone of right hand, initial encounter   2. Right arm pain   3. Right hand pain     I have personally viewed the imaging studies ordered this visit. Fx of base of R 5th metacarpal.  Orthopaedic tech to apply ulnar gutter splitn.  New Prescriptions   IBUPROFEN (ADVIL) 800 MG TABLET    Take 1 tablet (800 mg total) by mouth 3 (three) times daily with meals.    Orders Placed This Encounter  Procedures   DG Forearm Right   DG Hand Complete Right   Apply splint short arm   Apply Shoulder Immobilizer/Sling   Work/school excuse note: not needed. Recommend:  Follow-up Information     Schedule an appointment as soon as possible for a visit  with Orene Desanctis, MD.   Specialty: Orthopedic Surgery Contact information: [address] [place] [address] [place] [address] [phone]                Reviewed expectations re: course of current medical issues. Questions answered. Outlined signs and symptoms indicating need for more acute intervention. Patient verbalized understanding. After Visit Summary given.  SUBJECTIVE: History from: patient. Jorge Day is a 22 y.o. male who reports RIGHT hand and forearm pain; woke with pain after partying last evening. "Maybe hit it on something". Otherwise well. No extremity sensation changes or weakness. No tx PTA.   Past Surgical History:  Procedure Laterality Date   CLOSED REDUCTION FINGER WITH PERCUTANEOUS PINNING Right 07/23/2016   Procedure: CLOSED REDUCTION PERCUTANEOUS PINNING OF RIGHT SMALL METACARPAL FRACTURE;  Surgeon: Charlotte Crumb, MD;  Location: [facility];  Service: Orthopedics;  Laterality: Right;   TYMPANOSTOMY TUBE PLACEMENT        OBJECTIVE:  Vitals:   06/20/22 1013  BP: (!) 143/71  Pulse: 67  Resp: 16  Temp: 98.2 F (36.8 C)  TempSrc: Oral  SpO2: 96%     General appearance: alert; no distress HEENT: [place]; AT Neck: supple with FROM Resp: unlabored respirations Extremities: RUE: warm with well perfused appearance; fairly well localized moderate tenderness over right proximal 5th metacarpal; without gross deformities; swelling: minimal; bruising: none; 5th finger ROM: normal, with discomfort CV: brisk extremity capillary refill of RUE; 2+ radial pulse of RUE. Skin: warm and dry; no visible rashes Neurologic: gait normal; normal sensation and strength of RUE Psychological: alert and cooperative; normal mood and affect  Imaging: DG Hand Complete Right  Result Date: 06/20/2022 CLINICAL DATA:  Swelling and pain. EXAM: RIGHT HAND - COMPLETE 3+ VIEW COMPARISON:  02/22/2018. FINDINGS: Minimally displaced fracture at the base of the fifth metacarpal with involvement of the carpometacarpal joint. Old fifth metacarpal neck fracture. Dorsal soft tissue swelling. IMPRESSION: 1. Minimally displaced fracture at the base of the fifth metacarpal with involvement of the carpometacarpal joint. 2. Old fifth metacarpal neck fracture. Electronically Signed   By: Lorin Picket M.D.   On: 06/20/2022 10:44   DG Forearm Right  Result Date: 06/20/2022 CLINICAL DATA:  Swelling and pain. EXAM: RIGHT FOREARM - 2 VIEW COMPARISON:  None Available. FINDINGS: No acute osseous abnormality. IMPRESSION: Negative. Electronically Signed   By: Lorin Picket M.D.   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   MDD (major depressive disorder), recurrent, severe, with psychosis ([place]) 04/16/2015   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   Social History   Socioeconomic History   Marital  status: Single    Spouse name: Not on file   Number of children: Not on file   Years of education: Not on file   Highest education level: Not on file  Occupational History   Not on file  Tobacco Use   Smoking status: Never    Passive exposure: Yes   Smokeless tobacco: Never  Vaping Use   Vaping Use: Some days  Substance and Sexual Activity   Alcohol use: Yes   Drug use: No   Sexual activity: Yes    Birth control/protection: None  Other Topics Concern   Not on file  Social History Narrative   Not on file   Social Determinants of Health   Financial Resource Strain: Not on file  Food Insecurity: Not on file  Transportation Needs: Not on file  Physical Activity: Not on file  Stress: Not on file  Social Connections: Not on file   Family History  Problem Relation Age of Onset   Depression Mother    Hypertension Mother    Asthma Father    Alcohol abuse Father    Mental illness Brother    Mental illness Maternal Aunt    Cancer Maternal Grandmother    Brain cancer Maternal Grandmother    Past Surgical History:  Procedure Laterality Date   CLOSED REDUCTION FINGER WITH PERCUTANEOUS PINNING Right 07/23/2016   Procedure: CLOSED REDUCTION PERCUTANEOUS PINNING OF RIGHT SMALL METACARPAL FRACTURE;  Surgeon: Charlotte Crumb, MD;  Location: [facility];  Service: Orthopedics;  Laterality: Right;   TYMPANOSTOMY TUBE PLACEMENT         Vanessa Kick, MD 06/20/22 1103

## 2022-07-09 ENCOUNTER — Inpatient Hospital Stay (HOSPITAL_COMMUNITY): Admission: RE | Admit: 2022-07-09 | Payer: Medicaid Other | Source: Ambulatory Visit

## 2022-09-08 ENCOUNTER — Telehealth: Payer: Medicaid Other | Admitting: Urgent Care

## 2022-09-08 DIAGNOSIS — K2900 Acute gastritis without bleeding: Secondary | ICD-10-CM

## 2022-09-08 MED ORDER — SUCRALFATE 1 G PO TABS: 1.0000 g | ORAL_TABLET | Freq: Three times a day (TID) | ORAL | 0 refills | Status: AC

## 2022-09-08 MED ORDER — PANTOPRAZOLE SODIUM 40 MG PO TBEC: 40.0000 mg | DELAYED_RELEASE_TABLET | Freq: Every day | ORAL | 0 refills | Status: AC

## 2022-09-08 NOTE — Patient Instructions (Signed)
Jorge Day, thank you for joining Jorge Malling, PA for today's virtual visit.  While this provider is not your primary care provider (PCP), if your PCP is located in our provider database this encounter information will be shared with them immediately following your visit.   [facility] account gives you access to today's visit and all your visits, tests, and labs performed at [facility] " click here if you don't have a [facility] account or go to [email or web address]  Consent: (Patient) Jorge Day provided verbal consent for this virtual visit at the beginning of the encounter.  Current Medications:  Current Outpatient Medications:    pantoprazole (PROTONIX) 40 MG tablet, Take 1 tablet (40 mg total) by mouth daily., Disp: 30 tablet, Rfl: 0   sucralfate (CARAFATE) 1 g tablet, Take 1 tablet (1 g total) by mouth 4 (four) times daily -  with meals and at bedtime for 7 days., Disp: 28 tablet, Rfl: 0   albuterol (VENTOLIN HFA) 108 (90 Base) MCG/ACT inhaler, Inhale 2 puffs into the lungs every 6 (six) hours as needed for wheezing or shortness of breath. , Disp: , Rfl:    CONCERTA 18 MG CR tablet, Take 18 mg by mouth every morning., Disp: , Rfl:    hydrOXYzine (ATARAX/VISTARIL) 50 MG tablet, Take 1 tablet (50 mg total) by mouth at bedtime., Disp: 30 tablet, Rfl: 0   ibuprofen (ADVIL) 800 MG tablet, Take 1 tablet (800 mg total) by mouth 3 (three) times daily with meals., Disp: 21 tablet, Rfl: 0   traZODone (DESYREL) 50 MG tablet, Take 50 mg by mouth at bedtime as needed for sleep., Disp: , Rfl:    Medications ordered in this encounter:  Meds ordered this encounter  Medications   pantoprazole (PROTONIX) 40 MG tablet    Sig: Take 1 tablet (40 mg total) by mouth daily.    Dispense:  30 tablet    Refill:  0    Order Specific Question:   Supervising Provider    Answer:   Chase Picket [id number]   sucralfate (CARAFATE) 1 g tablet    Sig: Take 1 tablet  (1 g total) by mouth 4 (four) times daily -  with meals and at bedtime for 7 days.    Dispense:  28 tablet    Refill:  0    Order Specific Question:   Supervising Provider    Answer:   Chase Picket [id number]     *If you need refills on other medications prior to your next appointment, please contact your pharmacy*  Follow-Up: Call back or seek an in-person evaluation if the symptoms worsen or if the condition fails to improve as anticipated.  [place] [phone]  Other Instructions Start taking pantoprazole once daily, best taken 30 min before meals in the morning. Start taking sucralfate four times daily for the next week. STOP taking NSAIDs such as ibuprofen, aleve, motrin, advil as this could make your symptoms worse. If you develop any worsening pain, vomiting, fever, or blood, please head to the emergency room.   If you have been instructed to have an in-person evaluation today at a local Urgent Care facility, please use the link below. It will take you to a list of all of our available [facility], including address, phone number and hours of operation. Please do not delay care.  [facility]  If you or a family member do not have a primary  care provider, use the link below to schedule a visit and establish care. When you choose a [facility] primary care physician or advanced practice provider, you gain a long-term partner in health. Find a Primary Care Provider  Learn more about [facility]'s in-office and virtual care options: [facility]

## 2022-09-08 NOTE — Progress Notes (Signed)
Virtual Visit Consent   Jorge Day, you are scheduled for a virtual visit with a [facility] provider today. Just as with appointments in the office, your consent must be obtained to participate. Your consent will be active for this visit and any virtual visit you may have with one of our providers in the next 365 days. If you have a MyChart account, a copy of this consent can be sent to you electronically.  As this is a virtual visit, video technology does not allow for your provider to perform a traditional examination. This may limit your provider's ability to fully assess your condition. If your provider identifies any concerns that need to be evaluated in person or the need to arrange testing (such as labs, EKG, etc.), we will make arrangements to do so. Although advances in technology are sophisticated, we cannot ensure that it will always work on either your end or our end. If the connection with a video visit is poor, the visit may have to be switched to a telephone visit. With either a video or telephone visit, we are not always able to ensure that we have a secure connection.  By engaging in this virtual visit, you consent to the provision of healthcare and authorize for your insurance to be billed (if applicable) for the services provided during this visit. Depending on your insurance coverage, you may receive a charge related to this service.  I need to obtain your verbal consent now. Are you willing to proceed with your visit today? Jorge Day has provided verbal consent on 09/08/2022 for a virtual visit (video or telephone). Jorge Malling, PA  Date: 09/08/2022 10:48 AM  Virtual Visit via Video Note   I, Jorge Day, connected with  Jorge Day  ([id number], 2000-08-31) on 09/08/22 at 10:30 AM EDT by a video-enabled telemedicine application and verified that I am speaking with the correct person using two identifiers.  Location: Patient: Virtual Visit Location Patient:  Home Provider: Virtual Visit Location Provider: Home Office   I discussed the limitations of evaluation and management by telemedicine and the availability of in person appointments. The patient expressed understanding and agreed to proceed.    History of Present Illness: Jorge Day is a 22 y.o. who identifies as a male who was assigned male at birth, and is being seen today for epigastric pain.  HPI: 22yo male c/o 2day hx of burning epigastric pain. Uncertain what has caused this. States he had similar sx 3 years ago for which he went to ER. Was dx with gastritis at that time and was sent home on omeprazole (labs and CT in ER were unremarkable). Pt states he has been taking OTC ibuprofen for his symptoms without improvement. Admits it is worse with eating and laying back. Denies pain anywhere else. NO CP or palps. Has vomited twice in the past two days, the last one had "pink". He denies fever, diarrhea, dizziness, lightheadedness, dark stools. Still able to eat. Some belching but no distention or bloating. Has not tried any other OTC meds other than NSAIDs. Pt admits to vaping as well.     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   Intellectual disability 04/16/2015  Oppositional defiant disorder 04/15/2015   Family circumstance 12/16/2014   Psychosocial stressors 12/16/2014   Mild persistent asthma 10/29/2013   Seasonal allergies 10/29/2013    Allergies: No Known Allergies Medications:  Current Outpatient Medications:    pantoprazole (PROTONIX) 40 MG tablet, Take 1 tablet (40 mg total) by mouth daily., Disp: 30 tablet, Rfl: 0    sucralfate (CARAFATE) 1 g tablet, Take 1 tablet (1 g total) by mouth 4 (four) times daily -  with meals and at bedtime for 7 days., Disp: 28 tablet, Rfl: 0   albuterol (VENTOLIN HFA) 108 (90 Base) MCG/ACT inhaler, Inhale 2 puffs into the lungs every 6 (six) hours as needed for wheezing or shortness of breath. , Disp: , Rfl:    CONCERTA 18 MG CR tablet, Take 18 mg by mouth every morning., Disp: , Rfl:    hydrOXYzine (ATARAX/VISTARIL) 50 MG tablet, Take 1 tablet (50 mg total) by mouth at bedtime., Disp: 30 tablet, Rfl: 0   ibuprofen (ADVIL) 800 MG tablet, Take 1 tablet (800 mg total) by mouth 3 (three) times daily with meals., Disp: 21 tablet, Rfl: 0   traZODone (DESYREL) 50 MG tablet, Take 50 mg by mouth at bedtime as needed for sleep., Disp: , Rfl:   Observations/Objective: Patient is well-developed, well-nourished in no acute distress.  Resting comfortably at home.  Head is normocephalic, atraumatic.  No labored breathing.  Speech is clear and coherent with logical content.  Patient is alert and oriented at baseline.  Reproducible pain to self-palpation not noted.   Assessment and Plan: 1. Acute gastritis, presence of bleeding unspecified, unspecified gastritis type  Pt with hx of the same 3 years ago. ER record reviewed. MUST STOP NSAIDs immediately as this can worsen your symptoms. Due to "pink" noted in vomit x 1, will start sucralfate to cover for possible PUD. Will also have pt take pantoprazole daily. Strict ER precautions reviewed with pt several times. Pt able to understand and repeat directions.  Follow Up Instructions: I discussed the assessment and treatment plan with the patient. The patient was provided an opportunity to ask questions and all were answered. The patient agreed with the plan and demonstrated an understanding of the instructions.  A copy of instructions were sent to the patient via MyChart unless otherwise noted below.    The patient was advised to call back or  seek an in-person evaluation if the symptoms worsen or if the condition fails to improve as anticipated.  Time:  I spent 9 minutes with the patient via telehealth technology discussing the above problems/concerns.    [place], PA

## 2022-12-10 ENCOUNTER — Ambulatory Visit (HOSPITAL_COMMUNITY)
Admission: EM | Admit: 2022-12-10 | Discharge: 2022-12-10 | Disposition: A | Discharge status: Home / Self Care | Payer: MEDICAID | Attending: Internal Medicine | Admitting: Internal Medicine

## 2022-12-10 ENCOUNTER — Encounter (HOSPITAL_COMMUNITY): Payer: Self-pay

## 2022-12-10 DIAGNOSIS — H6121 Impacted cerumen, right ear: Secondary | ICD-10-CM | POA: Diagnosis not present

## 2022-12-10 NOTE — ED Provider Notes (Signed)
[facility]    CSN: [id number] Arrival date & time: 12/10/22  1506      History   Chief Complaint Chief Complaint  Patient presents with   Ear Pain    HPI Jorge Day is a 22 y.o. male.   Patient presents to urgent care for evaluation of right ear discomfort and ear fullness for the last 1 week.  States his hearing to the right ear is normal.  No symptoms of the left ear.  No fever, chills, other viral URI symptoms, body aches, dizziness, tinnitus, or drainage from the ears.  He has not attempted use of any over-the-counter medications to help with symptoms.     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   Influenza vaccination declined 04/12/2016   ADHD (attention deficit hyperactivity disorder), 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   Intellectual disability 04/16/2015   Oppositional defiant disorder 04/15/2015   Family circumstance 12/16/2014   Psychosocial stressors 12/16/2014   Mild persistent asthma 10/29/2013   Seasonal allergies 10/29/2013    Past Surgical History:  Procedure Laterality Date   CLOSED REDUCTION FINGER WITH PERCUTANEOUS PINNING Right 07/23/2016   Procedure: CLOSED REDUCTION PERCUTANEOUS PINNING OF RIGHT SMALL METACARPAL FRACTURE;  Surgeon: Dairl Ponder, MD;  Location: [facility] OR;  Service: Orthopedics;  Laterality:  Right;   TYMPANOSTOMY TUBE PLACEMENT         Home Medications    Prior to Admission medications   Medication Sig Start Date End Date Taking? Authorizing Provider  CONCERTA 18 MG CR tablet Take 18 mg by mouth every morning. 12/24/18  Yes [provider]  albuterol (VENTOLIN HFA) 108 (90 Base) MCG/ACT inhaler Inhale 2 puffs into the lungs every 6 (six) hours as needed for wheezing or shortness of breath.  10/19/15   [provider]  hydrOXYzine (ATARAX/VISTARIL) 50 MG tablet Take 1 tablet (50 mg total) by mouth at bedtime. 12/27/18   Leata Mouse, MD  ibuprofen (ADVIL) 800 MG tablet Take 1 tablet (800 mg total) by mouth 3 (three) times daily with meals. 06/20/22   Mardella Layman, MD  pantoprazole (PROTONIX) 40 MG tablet Take 1 tablet (40 mg total) by mouth daily. 09/08/22   Crain, Whitney L, PA  sucralfate (CARAFATE) 1 g tablet Take 1 tablet (1 g total) by mouth 4 (four) times daily -  with meals and at bedtime for 7 days. 09/08/22 09/15/22  Crain, Alphonzo Lemmings L, PA  traZODone (DESYREL) 50 MG tablet Take 50 mg by mouth at bedtime as needed for sleep. 05/14/19   [provider]  Cetirizine HCl 10 MG CAPS Take 1 capsule (10 mg total) by mouth daily. 02/27/19 04/13/20  Wieters, Hallie C, PA-C  dicyclomine (BENTYL) 20 MG tablet Take 1 tablet (20 mg total) by mouth 4 (four) times daily -  before meals and at bedtime. 02/27/19 07/18/19  Wieters, Hallie C, PA-C  fluticasone (  FLONASE) 50 MCG/ACT nasal spray Place 1 spray into both nostrils daily. Patient taking differently: Place 1 spray into both nostrils daily as needed for allergies.  05/12/19 04/13/20  Avegno, Zachery Dakins, FNP  OLANZapine (ZYPREXA) 15 MG tablet Take 15 mg by mouth at bedtime. Patient not taking: Reported on 02/07/2020 06/12/19 04/13/20  [provider]  omeprazole (PRILOSEC) 20 MG capsule Take 1 capsule (20 mg total) by mouth daily. 01/29/20 04/13/20  Georgetta Haber, NP    Family History Family History   Problem Relation Age of Onset   Depression Mother    Hypertension Mother    Asthma Father    Alcohol abuse Father    Mental illness Brother    Mental illness Maternal Aunt    Cancer Maternal Grandmother    Brain cancer Maternal Grandmother     Social History Social History   Tobacco Use   Smoking status: Never    Passive exposure: Yes   Smokeless tobacco: Never  Vaping Use   Vaping Use: Some days  Substance Use Topics   Alcohol use: Yes   Drug use: No     Allergies   Patient has no known allergies.   Review of Systems Review of Systems Per HPI  Physical Exam Triage Vital Signs ED Triage Vitals [12/10/22 1600]  Enc Vitals Group     BP 124/85     Pulse Rate 74     Resp 18     Temp 98.5 F (36.9 C)     Temp Source Oral     SpO2 98 %     Weight      Height      Head Circumference      Peak Flow      Pain Score      Pain Loc      Pain Edu?      Excl. in GC?    No data found.  Updated Vital Signs BP 124/85 (BP Location: Left Arm)   Pulse 74   Temp 98.5 F (36.9 C) (Oral)   Resp 18   SpO2 98%   Visual Acuity Right Eye Distance:   Left Eye Distance:   Bilateral Distance:    Right Eye Near:   Left Eye Near:    Bilateral Near:     Physical Exam Vitals and nursing note reviewed.  Constitutional:      Appearance: He is not ill-appearing or toxic-appearing.  HENT:     Head: Normocephalic and atraumatic.     Right Ear: Hearing and external ear normal. There is impacted cerumen (Impacted cerumen to the right ear on initial assessment.).     Left Ear: Hearing, tympanic membrane, ear canal and external ear normal.     Nose: Nose normal.     Mouth/Throat:     Lips: Pink.  Eyes:     General: Lids are normal. Vision grossly intact. Gaze aligned appropriately.     Extraocular Movements: Extraocular movements intact.     Conjunctiva/sclera: Conjunctivae normal.  Pulmonary:     Effort: Pulmonary effort is normal.  Musculoskeletal:     Cervical  back: Neck supple.  Skin:    General: Skin is warm and dry.     Capillary Refill: Capillary refill takes less than 2 seconds.     Findings: No rash.  Neurological:     General: No focal deficit present.     Mental Status: He is alert and oriented to person, place, and time. Mental status is  at baseline.     Cranial Nerves: No dysarthria or facial asymmetry.  Psychiatric:        Mood and Affect: Mood normal.        Speech: Speech normal.        Behavior: Behavior normal.        Thought Content: Thought content normal.        Judgment: Judgment normal.      UC Treatments / Results  Labs (all labs ordered are listed, but only abnormal results are displayed) Labs Reviewed - No data to display  EKG   Radiology No results found.  Procedures Procedures (including critical care time)  Medications Ordered in UC Medications - No data to display  Initial Impression / Assessment and Plan / UC Course  I have reviewed the triage vital signs and the nursing notes.  Pertinent labs & imaging results that were available during my care of the patient were reviewed by me and considered in my medical decision making (see chart for details).   1.  Impacted cerumen of right ear Right ear(s) cleaned with ear lavage to remove ear wax impactions bilaterally by nursing staff. Reassessment shows normal canal and TM. Patient may use debrox ear drops at home as needed for wax removal and has been advised to avoid using Q-tips.   Discussed red flag signs and symptoms of worsening condition,when to call the PCP office, return to urgent care, and when to seek higher level of care in the emergency department. Counseled patient regarding appropriate use of medications and potential side effects for all medications recommended or prescribed today. Patient verbalizes understanding and agreement with plan. Discharged in stable condition.     Final Clinical Impressions(s) / UC Diagnoses   Final diagnoses:   Impacted cerumen of right ear     Discharge Instructions      We removed ear wax from your right ear today.  You may purchase debrox ear drops over the counter and use these as needed for ear wax impactions at home.  Return to urgent care as needed.     ED Prescriptions   None    PDMP not reviewed this encounter.   Carlisle Beers, FNP 12/10/22 1700

## 2022-12-10 NOTE — ED Triage Notes (Signed)
Here for right ear pain and fullness x 1 week.

## 2022-12-10 NOTE — Discharge Instructions (Signed)
We removed ear wax from your right ear today.  You may purchase debrox ear drops over the counter and use these as needed for ear wax impactions at home.  Return to urgent care as needed.
